# Patient Record
Sex: Female | Born: 1995 | Race: White | Hispanic: No | Marital: Single | Smoking: Current every day smoker
Health system: Southern US, Community
[De-identification: ages and names within clinical notes are randomized; demographics above are authoritative.]

## PROBLEM LIST (undated history)

## (undated) DIAGNOSIS — Z1379 Encounter for other screening for genetic and chromosomal anomalies: Secondary | ICD-10-CM

## (undated) DIAGNOSIS — Z808 Family history of malignant neoplasm of other organs or systems: Secondary | ICD-10-CM

## (undated) DIAGNOSIS — Z803 Family history of malignant neoplasm of breast: Secondary | ICD-10-CM

## (undated) DIAGNOSIS — Z800 Family history of malignant neoplasm of digestive organs: Secondary | ICD-10-CM

## (undated) DIAGNOSIS — E119 Type 2 diabetes mellitus without complications: Secondary | ICD-10-CM

## (undated) DIAGNOSIS — J020 Streptococcal pharyngitis: Secondary | ICD-10-CM

## (undated) DIAGNOSIS — F418 Other specified anxiety disorders: Secondary | ICD-10-CM

## (undated) DIAGNOSIS — R7302 Impaired glucose tolerance (oral): Secondary | ICD-10-CM

## (undated) DIAGNOSIS — F909 Attention-deficit hyperactivity disorder, unspecified type: Secondary | ICD-10-CM

## (undated) DIAGNOSIS — R000 Tachycardia, unspecified: Secondary | ICD-10-CM

## (undated) DIAGNOSIS — Z8049 Family history of malignant neoplasm of other genital organs: Secondary | ICD-10-CM

## (undated) DIAGNOSIS — F319 Bipolar disorder, unspecified: Secondary | ICD-10-CM

## (undated) DIAGNOSIS — I10 Essential (primary) hypertension: Secondary | ICD-10-CM

## (undated) HISTORY — DX: Other specified anxiety disorders: F41.8

## (undated) HISTORY — DX: Family history of malignant neoplasm of breast: Z80.3

## (undated) HISTORY — DX: Family history of malignant neoplasm of digestive organs: Z80.0

## (undated) HISTORY — DX: Attention-deficit hyperactivity disorder, unspecified type: F90.9

## (undated) HISTORY — DX: Family history of malignant neoplasm of other organs or systems: Z80.8

## (undated) HISTORY — DX: Type 2 diabetes mellitus without complications: E11.9

## (undated) HISTORY — DX: Bipolar disorder, unspecified: F31.9

## (undated) HISTORY — DX: Tachycardia, unspecified: R00.0

## (undated) HISTORY — DX: Encounter for other screening for genetic and chromosomal anomalies: Z13.79

## (undated) HISTORY — DX: Family history of malignant neoplasm of other genital organs: Z80.49

## (undated) HISTORY — DX: Impaired glucose tolerance (oral): R73.02

---
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
## 2016-07-04 HISTORY — PX: AUGMENTATION MAMMAPLASTY: SUR837

## 2017-07-07 DIAGNOSIS — F3132 Bipolar disorder, current episode depressed, moderate: Secondary | ICD-10-CM | POA: Diagnosis not present

## 2017-07-07 DIAGNOSIS — F902 Attention-deficit hyperactivity disorder, combined type: Secondary | ICD-10-CM | POA: Diagnosis not present

## 2017-07-07 DIAGNOSIS — F419 Anxiety disorder, unspecified: Secondary | ICD-10-CM | POA: Diagnosis not present

## 2017-07-21 DIAGNOSIS — Z23 Encounter for immunization: Secondary | ICD-10-CM | POA: Diagnosis not present

## 2017-08-04 DIAGNOSIS — F902 Attention-deficit hyperactivity disorder, combined type: Secondary | ICD-10-CM | POA: Diagnosis not present

## 2017-08-04 DIAGNOSIS — F419 Anxiety disorder, unspecified: Secondary | ICD-10-CM | POA: Diagnosis not present

## 2017-08-04 DIAGNOSIS — F3132 Bipolar disorder, current episode depressed, moderate: Secondary | ICD-10-CM | POA: Diagnosis not present

## 2017-08-21 DIAGNOSIS — F314 Bipolar disorder, current episode depressed, severe, without psychotic features: Secondary | ICD-10-CM | POA: Diagnosis not present

## 2017-08-28 DIAGNOSIS — F314 Bipolar disorder, current episode depressed, severe, without psychotic features: Secondary | ICD-10-CM | POA: Diagnosis not present

## 2017-08-28 DIAGNOSIS — G5602 Carpal tunnel syndrome, left upper limb: Secondary | ICD-10-CM | POA: Diagnosis not present

## 2017-09-11 DIAGNOSIS — F314 Bipolar disorder, current episode depressed, severe, without psychotic features: Secondary | ICD-10-CM | POA: Diagnosis not present

## 2017-09-18 DIAGNOSIS — F314 Bipolar disorder, current episode depressed, severe, without psychotic features: Secondary | ICD-10-CM | POA: Diagnosis not present

## 2017-09-22 DIAGNOSIS — F3132 Bipolar disorder, current episode depressed, moderate: Secondary | ICD-10-CM | POA: Diagnosis not present

## 2017-09-22 DIAGNOSIS — F902 Attention-deficit hyperactivity disorder, combined type: Secondary | ICD-10-CM | POA: Diagnosis not present

## 2017-09-22 DIAGNOSIS — F419 Anxiety disorder, unspecified: Secondary | ICD-10-CM | POA: Diagnosis not present

## 2017-10-02 DIAGNOSIS — F314 Bipolar disorder, current episode depressed, severe, without psychotic features: Secondary | ICD-10-CM | POA: Diagnosis not present

## 2017-10-10 DIAGNOSIS — F314 Bipolar disorder, current episode depressed, severe, without psychotic features: Secondary | ICD-10-CM | POA: Diagnosis not present

## 2017-10-13 DIAGNOSIS — J22 Unspecified acute lower respiratory infection: Secondary | ICD-10-CM | POA: Diagnosis not present

## 2017-10-13 DIAGNOSIS — R112 Nausea with vomiting, unspecified: Secondary | ICD-10-CM | POA: Diagnosis not present

## 2017-10-13 DIAGNOSIS — R05 Cough: Secondary | ICD-10-CM | POA: Diagnosis not present

## 2017-10-17 DIAGNOSIS — F314 Bipolar disorder, current episode depressed, severe, without psychotic features: Secondary | ICD-10-CM | POA: Diagnosis not present

## 2017-10-24 DIAGNOSIS — F314 Bipolar disorder, current episode depressed, severe, without psychotic features: Secondary | ICD-10-CM | POA: Diagnosis not present

## 2017-10-31 DIAGNOSIS — F314 Bipolar disorder, current episode depressed, severe, without psychotic features: Secondary | ICD-10-CM | POA: Diagnosis not present

## 2017-11-03 DIAGNOSIS — L03211 Cellulitis of face: Secondary | ICD-10-CM | POA: Diagnosis not present

## 2017-11-05 DIAGNOSIS — L03211 Cellulitis of face: Secondary | ICD-10-CM | POA: Diagnosis not present

## 2017-11-07 DIAGNOSIS — F314 Bipolar disorder, current episode depressed, severe, without psychotic features: Secondary | ICD-10-CM | POA: Diagnosis not present

## 2017-11-14 DIAGNOSIS — F314 Bipolar disorder, current episode depressed, severe, without psychotic features: Secondary | ICD-10-CM | POA: Diagnosis not present

## 2017-11-16 DIAGNOSIS — F3132 Bipolar disorder, current episode depressed, moderate: Secondary | ICD-10-CM | POA: Diagnosis not present

## 2017-11-16 DIAGNOSIS — F419 Anxiety disorder, unspecified: Secondary | ICD-10-CM | POA: Diagnosis not present

## 2017-11-16 DIAGNOSIS — F902 Attention-deficit hyperactivity disorder, combined type: Secondary | ICD-10-CM | POA: Diagnosis not present

## 2017-11-22 DIAGNOSIS — F314 Bipolar disorder, current episode depressed, severe, without psychotic features: Secondary | ICD-10-CM | POA: Diagnosis not present

## 2017-11-29 DIAGNOSIS — F314 Bipolar disorder, current episode depressed, severe, without psychotic features: Secondary | ICD-10-CM | POA: Diagnosis not present

## 2017-11-30 DIAGNOSIS — J358 Other chronic diseases of tonsils and adenoids: Secondary | ICD-10-CM | POA: Diagnosis not present

## 2017-11-30 DIAGNOSIS — R070 Pain in throat: Secondary | ICD-10-CM | POA: Diagnosis not present

## 2017-11-30 DIAGNOSIS — R0982 Postnasal drip: Secondary | ICD-10-CM | POA: Diagnosis not present

## 2017-12-14 DIAGNOSIS — F314 Bipolar disorder, current episode depressed, severe, without psychotic features: Secondary | ICD-10-CM | POA: Diagnosis not present

## 2017-12-14 DIAGNOSIS — F902 Attention-deficit hyperactivity disorder, combined type: Secondary | ICD-10-CM | POA: Diagnosis not present

## 2017-12-14 DIAGNOSIS — F419 Anxiety disorder, unspecified: Secondary | ICD-10-CM | POA: Diagnosis not present

## 2017-12-14 DIAGNOSIS — F3132 Bipolar disorder, current episode depressed, moderate: Secondary | ICD-10-CM | POA: Diagnosis not present

## 2017-12-27 DIAGNOSIS — F419 Anxiety disorder, unspecified: Secondary | ICD-10-CM | POA: Diagnosis not present

## 2017-12-27 DIAGNOSIS — F39 Unspecified mood [affective] disorder: Secondary | ICD-10-CM | POA: Diagnosis not present

## 2017-12-27 DIAGNOSIS — I10 Essential (primary) hypertension: Secondary | ICD-10-CM | POA: Diagnosis not present

## 2017-12-27 DIAGNOSIS — F329 Major depressive disorder, single episode, unspecified: Secondary | ICD-10-CM | POA: Diagnosis not present

## 2018-01-02 DIAGNOSIS — F314 Bipolar disorder, current episode depressed, severe, without psychotic features: Secondary | ICD-10-CM | POA: Diagnosis not present

## 2018-01-11 DIAGNOSIS — F3132 Bipolar disorder, current episode depressed, moderate: Secondary | ICD-10-CM | POA: Diagnosis not present

## 2018-01-11 DIAGNOSIS — F419 Anxiety disorder, unspecified: Secondary | ICD-10-CM | POA: Diagnosis not present

## 2018-01-11 DIAGNOSIS — F902 Attention-deficit hyperactivity disorder, combined type: Secondary | ICD-10-CM | POA: Diagnosis not present

## 2018-01-15 DIAGNOSIS — F314 Bipolar disorder, current episode depressed, severe, without psychotic features: Secondary | ICD-10-CM | POA: Diagnosis not present

## 2018-01-23 DIAGNOSIS — S93402A Sprain of unspecified ligament of left ankle, initial encounter: Secondary | ICD-10-CM | POA: Diagnosis not present

## 2018-01-23 DIAGNOSIS — M25572 Pain in left ankle and joints of left foot: Secondary | ICD-10-CM | POA: Diagnosis not present

## 2018-01-24 DIAGNOSIS — F314 Bipolar disorder, current episode depressed, severe, without psychotic features: Secondary | ICD-10-CM | POA: Diagnosis not present

## 2018-02-01 DIAGNOSIS — F902 Attention-deficit hyperactivity disorder, combined type: Secondary | ICD-10-CM | POA: Diagnosis not present

## 2018-02-01 DIAGNOSIS — F3132 Bipolar disorder, current episode depressed, moderate: Secondary | ICD-10-CM | POA: Diagnosis not present

## 2018-02-01 DIAGNOSIS — F314 Bipolar disorder, current episode depressed, severe, without psychotic features: Secondary | ICD-10-CM | POA: Diagnosis not present

## 2018-02-01 DIAGNOSIS — F419 Anxiety disorder, unspecified: Secondary | ICD-10-CM | POA: Diagnosis not present

## 2018-02-07 DIAGNOSIS — F314 Bipolar disorder, current episode depressed, severe, without psychotic features: Secondary | ICD-10-CM | POA: Diagnosis not present

## 2018-02-19 DIAGNOSIS — F314 Bipolar disorder, current episode depressed, severe, without psychotic features: Secondary | ICD-10-CM | POA: Diagnosis not present

## 2018-02-28 DIAGNOSIS — F314 Bipolar disorder, current episode depressed, severe, without psychotic features: Secondary | ICD-10-CM | POA: Diagnosis not present

## 2018-03-01 DIAGNOSIS — H903 Sensorineural hearing loss, bilateral: Secondary | ICD-10-CM | POA: Diagnosis not present

## 2018-03-05 DIAGNOSIS — F314 Bipolar disorder, current episode depressed, severe, without psychotic features: Secondary | ICD-10-CM | POA: Diagnosis not present

## 2018-03-06 DIAGNOSIS — F3132 Bipolar disorder, current episode depressed, moderate: Secondary | ICD-10-CM | POA: Diagnosis not present

## 2018-03-06 DIAGNOSIS — F419 Anxiety disorder, unspecified: Secondary | ICD-10-CM | POA: Diagnosis not present

## 2018-03-06 DIAGNOSIS — F902 Attention-deficit hyperactivity disorder, combined type: Secondary | ICD-10-CM | POA: Diagnosis not present

## 2018-03-07 DIAGNOSIS — Z5181 Encounter for therapeutic drug level monitoring: Secondary | ICD-10-CM | POA: Diagnosis not present

## 2018-03-14 DIAGNOSIS — M542 Cervicalgia: Secondary | ICD-10-CM | POA: Diagnosis not present

## 2018-03-14 DIAGNOSIS — R51 Headache: Secondary | ICD-10-CM | POA: Diagnosis not present

## 2018-03-14 DIAGNOSIS — M545 Low back pain: Secondary | ICD-10-CM | POA: Diagnosis not present

## 2018-03-14 DIAGNOSIS — Z1331 Encounter for screening for depression: Secondary | ICD-10-CM | POA: Diagnosis not present

## 2018-03-14 DIAGNOSIS — F314 Bipolar disorder, current episode depressed, severe, without psychotic features: Secondary | ICD-10-CM | POA: Diagnosis not present

## 2018-03-21 DIAGNOSIS — F314 Bipolar disorder, current episode depressed, severe, without psychotic features: Secondary | ICD-10-CM | POA: Diagnosis not present

## 2018-03-26 DIAGNOSIS — F314 Bipolar disorder, current episode depressed, severe, without psychotic features: Secondary | ICD-10-CM | POA: Diagnosis not present

## 2018-03-29 DIAGNOSIS — F314 Bipolar disorder, current episode depressed, severe, without psychotic features: Secondary | ICD-10-CM | POA: Diagnosis not present

## 2018-04-03 DIAGNOSIS — F3132 Bipolar disorder, current episode depressed, moderate: Secondary | ICD-10-CM | POA: Diagnosis not present

## 2018-04-03 DIAGNOSIS — F902 Attention-deficit hyperactivity disorder, combined type: Secondary | ICD-10-CM | POA: Diagnosis not present

## 2018-04-03 DIAGNOSIS — F314 Bipolar disorder, current episode depressed, severe, without psychotic features: Secondary | ICD-10-CM | POA: Diagnosis not present

## 2018-04-03 DIAGNOSIS — F419 Anxiety disorder, unspecified: Secondary | ICD-10-CM | POA: Diagnosis not present

## 2018-04-05 DIAGNOSIS — F314 Bipolar disorder, current episode depressed, severe, without psychotic features: Secondary | ICD-10-CM | POA: Diagnosis not present

## 2018-04-09 DIAGNOSIS — F314 Bipolar disorder, current episode depressed, severe, without psychotic features: Secondary | ICD-10-CM | POA: Diagnosis not present

## 2018-04-11 DIAGNOSIS — F314 Bipolar disorder, current episode depressed, severe, without psychotic features: Secondary | ICD-10-CM | POA: Diagnosis not present

## 2018-04-17 DIAGNOSIS — F314 Bipolar disorder, current episode depressed, severe, without psychotic features: Secondary | ICD-10-CM | POA: Diagnosis not present

## 2018-04-19 DIAGNOSIS — F314 Bipolar disorder, current episode depressed, severe, without psychotic features: Secondary | ICD-10-CM | POA: Diagnosis not present

## 2018-04-24 DIAGNOSIS — F314 Bipolar disorder, current episode depressed, severe, without psychotic features: Secondary | ICD-10-CM | POA: Diagnosis not present

## 2018-04-26 DIAGNOSIS — F314 Bipolar disorder, current episode depressed, severe, without psychotic features: Secondary | ICD-10-CM | POA: Diagnosis not present

## 2018-04-30 DIAGNOSIS — F314 Bipolar disorder, current episode depressed, severe, without psychotic features: Secondary | ICD-10-CM | POA: Diagnosis not present

## 2018-05-01 DIAGNOSIS — F3132 Bipolar disorder, current episode depressed, moderate: Secondary | ICD-10-CM | POA: Diagnosis not present

## 2018-05-01 DIAGNOSIS — F419 Anxiety disorder, unspecified: Secondary | ICD-10-CM | POA: Diagnosis not present

## 2018-05-01 DIAGNOSIS — F902 Attention-deficit hyperactivity disorder, combined type: Secondary | ICD-10-CM | POA: Diagnosis not present

## 2018-05-02 DIAGNOSIS — F314 Bipolar disorder, current episode depressed, severe, without psychotic features: Secondary | ICD-10-CM | POA: Diagnosis not present

## 2018-05-08 DIAGNOSIS — F314 Bipolar disorder, current episode depressed, severe, without psychotic features: Secondary | ICD-10-CM | POA: Diagnosis not present

## 2018-05-10 DIAGNOSIS — F314 Bipolar disorder, current episode depressed, severe, without psychotic features: Secondary | ICD-10-CM | POA: Diagnosis not present

## 2018-05-14 DIAGNOSIS — F314 Bipolar disorder, current episode depressed, severe, without psychotic features: Secondary | ICD-10-CM | POA: Diagnosis not present

## 2018-05-22 DIAGNOSIS — F314 Bipolar disorder, current episode depressed, severe, without psychotic features: Secondary | ICD-10-CM | POA: Diagnosis not present

## 2018-05-29 DIAGNOSIS — F419 Anxiety disorder, unspecified: Secondary | ICD-10-CM | POA: Diagnosis not present

## 2018-05-29 DIAGNOSIS — F3132 Bipolar disorder, current episode depressed, moderate: Secondary | ICD-10-CM | POA: Diagnosis not present

## 2018-05-29 DIAGNOSIS — F314 Bipolar disorder, current episode depressed, severe, without psychotic features: Secondary | ICD-10-CM | POA: Diagnosis not present

## 2018-05-29 DIAGNOSIS — F902 Attention-deficit hyperactivity disorder, combined type: Secondary | ICD-10-CM | POA: Diagnosis not present

## 2018-06-05 DIAGNOSIS — F315 Bipolar disorder, current episode depressed, severe, with psychotic features: Secondary | ICD-10-CM | POA: Diagnosis not present

## 2018-06-14 DIAGNOSIS — F315 Bipolar disorder, current episode depressed, severe, with psychotic features: Secondary | ICD-10-CM | POA: Diagnosis not present

## 2018-06-19 DIAGNOSIS — F902 Attention-deficit hyperactivity disorder, combined type: Secondary | ICD-10-CM | POA: Diagnosis not present

## 2018-06-19 DIAGNOSIS — F3132 Bipolar disorder, current episode depressed, moderate: Secondary | ICD-10-CM | POA: Diagnosis not present

## 2018-06-19 DIAGNOSIS — F315 Bipolar disorder, current episode depressed, severe, with psychotic features: Secondary | ICD-10-CM | POA: Diagnosis not present

## 2018-06-19 DIAGNOSIS — F419 Anxiety disorder, unspecified: Secondary | ICD-10-CM | POA: Diagnosis not present

## 2018-06-21 DIAGNOSIS — F315 Bipolar disorder, current episode depressed, severe, with psychotic features: Secondary | ICD-10-CM | POA: Diagnosis not present

## 2018-06-25 DIAGNOSIS — F315 Bipolar disorder, current episode depressed, severe, with psychotic features: Secondary | ICD-10-CM | POA: Diagnosis not present

## 2018-06-28 DIAGNOSIS — F315 Bipolar disorder, current episode depressed, severe, with psychotic features: Secondary | ICD-10-CM | POA: Diagnosis not present

## 2018-07-03 DIAGNOSIS — F315 Bipolar disorder, current episode depressed, severe, with psychotic features: Secondary | ICD-10-CM | POA: Diagnosis not present

## 2018-07-05 DIAGNOSIS — F315 Bipolar disorder, current episode depressed, severe, with psychotic features: Secondary | ICD-10-CM | POA: Diagnosis not present

## 2018-07-10 DIAGNOSIS — F315 Bipolar disorder, current episode depressed, severe, with psychotic features: Secondary | ICD-10-CM | POA: Diagnosis not present

## 2018-07-13 DIAGNOSIS — F315 Bipolar disorder, current episode depressed, severe, with psychotic features: Secondary | ICD-10-CM | POA: Diagnosis not present

## 2018-07-17 DIAGNOSIS — F3132 Bipolar disorder, current episode depressed, moderate: Secondary | ICD-10-CM | POA: Diagnosis not present

## 2018-07-17 DIAGNOSIS — F902 Attention-deficit hyperactivity disorder, combined type: Secondary | ICD-10-CM | POA: Diagnosis not present

## 2018-07-17 DIAGNOSIS — F315 Bipolar disorder, current episode depressed, severe, with psychotic features: Secondary | ICD-10-CM | POA: Diagnosis not present

## 2018-07-17 DIAGNOSIS — F419 Anxiety disorder, unspecified: Secondary | ICD-10-CM | POA: Diagnosis not present

## 2018-07-24 DIAGNOSIS — F315 Bipolar disorder, current episode depressed, severe, with psychotic features: Secondary | ICD-10-CM | POA: Diagnosis not present

## 2018-07-27 DIAGNOSIS — F315 Bipolar disorder, current episode depressed, severe, with psychotic features: Secondary | ICD-10-CM | POA: Diagnosis not present

## 2018-07-31 DIAGNOSIS — F315 Bipolar disorder, current episode depressed, severe, with psychotic features: Secondary | ICD-10-CM | POA: Diagnosis not present

## 2018-08-03 DIAGNOSIS — E785 Hyperlipidemia, unspecified: Secondary | ICD-10-CM | POA: Diagnosis not present

## 2018-08-03 DIAGNOSIS — I10 Essential (primary) hypertension: Secondary | ICD-10-CM | POA: Diagnosis not present

## 2018-08-03 DIAGNOSIS — E119 Type 2 diabetes mellitus without complications: Secondary | ICD-10-CM | POA: Diagnosis not present

## 2018-08-03 DIAGNOSIS — F39 Unspecified mood [affective] disorder: Secondary | ICD-10-CM | POA: Diagnosis not present

## 2018-08-03 DIAGNOSIS — F419 Anxiety disorder, unspecified: Secondary | ICD-10-CM | POA: Diagnosis not present

## 2018-08-07 DIAGNOSIS — F315 Bipolar disorder, current episode depressed, severe, with psychotic features: Secondary | ICD-10-CM | POA: Diagnosis not present

## 2018-08-14 DIAGNOSIS — F419 Anxiety disorder, unspecified: Secondary | ICD-10-CM | POA: Diagnosis not present

## 2018-08-14 DIAGNOSIS — F3132 Bipolar disorder, current episode depressed, moderate: Secondary | ICD-10-CM | POA: Diagnosis not present

## 2018-08-14 DIAGNOSIS — F902 Attention-deficit hyperactivity disorder, combined type: Secondary | ICD-10-CM | POA: Diagnosis not present

## 2018-08-15 DIAGNOSIS — F315 Bipolar disorder, current episode depressed, severe, with psychotic features: Secondary | ICD-10-CM | POA: Diagnosis not present

## 2018-08-22 DIAGNOSIS — F315 Bipolar disorder, current episode depressed, severe, with psychotic features: Secondary | ICD-10-CM | POA: Diagnosis not present

## 2018-08-28 DIAGNOSIS — F315 Bipolar disorder, current episode depressed, severe, with psychotic features: Secondary | ICD-10-CM | POA: Diagnosis not present

## 2018-08-31 DIAGNOSIS — F315 Bipolar disorder, current episode depressed, severe, with psychotic features: Secondary | ICD-10-CM | POA: Diagnosis not present

## 2018-09-04 DIAGNOSIS — F315 Bipolar disorder, current episode depressed, severe, with psychotic features: Secondary | ICD-10-CM | POA: Diagnosis not present

## 2018-09-14 DIAGNOSIS — F315 Bipolar disorder, current episode depressed, severe, with psychotic features: Secondary | ICD-10-CM | POA: Diagnosis not present

## 2018-09-18 DIAGNOSIS — F315 Bipolar disorder, current episode depressed, severe, with psychotic features: Secondary | ICD-10-CM | POA: Diagnosis not present

## 2018-09-20 DIAGNOSIS — Z6826 Body mass index (BMI) 26.0-26.9, adult: Secondary | ICD-10-CM | POA: Diagnosis not present

## 2018-09-20 DIAGNOSIS — Z118 Encounter for screening for other infectious and parasitic diseases: Secondary | ICD-10-CM | POA: Diagnosis not present

## 2018-09-20 DIAGNOSIS — Z113 Encounter for screening for infections with a predominantly sexual mode of transmission: Secondary | ICD-10-CM | POA: Diagnosis not present

## 2018-09-20 DIAGNOSIS — Z01419 Encounter for gynecological examination (general) (routine) without abnormal findings: Secondary | ICD-10-CM | POA: Diagnosis not present

## 2018-09-20 DIAGNOSIS — Z114 Encounter for screening for human immunodeficiency virus [HIV]: Secondary | ICD-10-CM | POA: Diagnosis not present

## 2018-09-20 DIAGNOSIS — Z1159 Encounter for screening for other viral diseases: Secondary | ICD-10-CM | POA: Diagnosis not present

## 2018-09-21 DIAGNOSIS — F315 Bipolar disorder, current episode depressed, severe, with psychotic features: Secondary | ICD-10-CM | POA: Diagnosis not present

## 2018-09-25 DIAGNOSIS — F315 Bipolar disorder, current episode depressed, severe, with psychotic features: Secondary | ICD-10-CM | POA: Diagnosis not present

## 2018-09-27 DIAGNOSIS — F315 Bipolar disorder, current episode depressed, severe, with psychotic features: Secondary | ICD-10-CM | POA: Diagnosis not present

## 2018-10-09 DIAGNOSIS — F315 Bipolar disorder, current episode depressed, severe, with psychotic features: Secondary | ICD-10-CM | POA: Diagnosis not present

## 2018-10-10 DIAGNOSIS — F419 Anxiety disorder, unspecified: Secondary | ICD-10-CM | POA: Diagnosis not present

## 2018-10-10 DIAGNOSIS — F902 Attention-deficit hyperactivity disorder, combined type: Secondary | ICD-10-CM | POA: Diagnosis not present

## 2018-10-10 DIAGNOSIS — F3132 Bipolar disorder, current episode depressed, moderate: Secondary | ICD-10-CM | POA: Diagnosis not present

## 2018-10-16 DIAGNOSIS — F315 Bipolar disorder, current episode depressed, severe, with psychotic features: Secondary | ICD-10-CM | POA: Diagnosis not present

## 2018-10-18 DIAGNOSIS — F315 Bipolar disorder, current episode depressed, severe, with psychotic features: Secondary | ICD-10-CM | POA: Diagnosis not present

## 2018-10-23 DIAGNOSIS — F315 Bipolar disorder, current episode depressed, severe, with psychotic features: Secondary | ICD-10-CM | POA: Diagnosis not present

## 2018-10-28 DIAGNOSIS — Z202 Contact with and (suspected) exposure to infections with a predominantly sexual mode of transmission: Secondary | ICD-10-CM | POA: Diagnosis not present

## 2018-10-28 DIAGNOSIS — K210 Gastro-esophageal reflux disease with esophagitis: Secondary | ICD-10-CM | POA: Diagnosis not present

## 2018-10-30 DIAGNOSIS — F314 Bipolar disorder, current episode depressed, severe, without psychotic features: Secondary | ICD-10-CM | POA: Diagnosis not present

## 2018-10-30 DIAGNOSIS — F411 Generalized anxiety disorder: Secondary | ICD-10-CM | POA: Diagnosis not present

## 2018-10-30 DIAGNOSIS — F909 Attention-deficit hyperactivity disorder, unspecified type: Secondary | ICD-10-CM | POA: Diagnosis not present

## 2018-11-06 DIAGNOSIS — F909 Attention-deficit hyperactivity disorder, unspecified type: Secondary | ICD-10-CM | POA: Diagnosis not present

## 2018-11-06 DIAGNOSIS — F411 Generalized anxiety disorder: Secondary | ICD-10-CM | POA: Diagnosis not present

## 2018-11-06 DIAGNOSIS — F314 Bipolar disorder, current episode depressed, severe, without psychotic features: Secondary | ICD-10-CM | POA: Diagnosis not present

## 2018-11-14 DIAGNOSIS — F411 Generalized anxiety disorder: Secondary | ICD-10-CM | POA: Diagnosis not present

## 2018-11-14 DIAGNOSIS — I10 Essential (primary) hypertension: Secondary | ICD-10-CM | POA: Diagnosis not present

## 2018-11-14 DIAGNOSIS — F900 Attention-deficit hyperactivity disorder, predominantly inattentive type: Secondary | ICD-10-CM | POA: Diagnosis not present

## 2018-11-14 DIAGNOSIS — F3160 Bipolar disorder, current episode mixed, unspecified: Secondary | ICD-10-CM | POA: Diagnosis not present

## 2018-11-15 DIAGNOSIS — F909 Attention-deficit hyperactivity disorder, unspecified type: Secondary | ICD-10-CM | POA: Diagnosis not present

## 2018-11-15 DIAGNOSIS — F411 Generalized anxiety disorder: Secondary | ICD-10-CM | POA: Diagnosis not present

## 2018-11-15 DIAGNOSIS — F314 Bipolar disorder, current episode depressed, severe, without psychotic features: Secondary | ICD-10-CM | POA: Diagnosis not present

## 2018-11-16 DIAGNOSIS — F411 Generalized anxiety disorder: Secondary | ICD-10-CM | POA: Diagnosis not present

## 2018-11-16 DIAGNOSIS — F314 Bipolar disorder, current episode depressed, severe, without psychotic features: Secondary | ICD-10-CM | POA: Diagnosis not present

## 2018-11-16 DIAGNOSIS — F909 Attention-deficit hyperactivity disorder, unspecified type: Secondary | ICD-10-CM | POA: Diagnosis not present

## 2018-11-19 DIAGNOSIS — F909 Attention-deficit hyperactivity disorder, unspecified type: Secondary | ICD-10-CM | POA: Diagnosis not present

## 2018-11-19 DIAGNOSIS — F411 Generalized anxiety disorder: Secondary | ICD-10-CM | POA: Diagnosis not present

## 2018-11-19 DIAGNOSIS — F314 Bipolar disorder, current episode depressed, severe, without psychotic features: Secondary | ICD-10-CM | POA: Diagnosis not present

## 2018-11-21 DIAGNOSIS — F411 Generalized anxiety disorder: Secondary | ICD-10-CM | POA: Diagnosis not present

## 2018-11-21 DIAGNOSIS — F314 Bipolar disorder, current episode depressed, severe, without psychotic features: Secondary | ICD-10-CM | POA: Diagnosis not present

## 2018-11-21 DIAGNOSIS — F909 Attention-deficit hyperactivity disorder, unspecified type: Secondary | ICD-10-CM | POA: Diagnosis not present

## 2018-11-27 DIAGNOSIS — F411 Generalized anxiety disorder: Secondary | ICD-10-CM | POA: Diagnosis not present

## 2018-11-27 DIAGNOSIS — F909 Attention-deficit hyperactivity disorder, unspecified type: Secondary | ICD-10-CM | POA: Diagnosis not present

## 2018-11-27 DIAGNOSIS — F314 Bipolar disorder, current episode depressed, severe, without psychotic features: Secondary | ICD-10-CM | POA: Diagnosis not present

## 2018-11-28 DIAGNOSIS — F909 Attention-deficit hyperactivity disorder, unspecified type: Secondary | ICD-10-CM | POA: Diagnosis not present

## 2018-11-28 DIAGNOSIS — F314 Bipolar disorder, current episode depressed, severe, without psychotic features: Secondary | ICD-10-CM | POA: Diagnosis not present

## 2018-11-28 DIAGNOSIS — F411 Generalized anxiety disorder: Secondary | ICD-10-CM | POA: Diagnosis not present

## 2018-11-28 DIAGNOSIS — F419 Anxiety disorder, unspecified: Secondary | ICD-10-CM | POA: Diagnosis not present

## 2018-11-28 DIAGNOSIS — F900 Attention-deficit hyperactivity disorder, predominantly inattentive type: Secondary | ICD-10-CM | POA: Diagnosis not present

## 2018-11-30 DIAGNOSIS — F314 Bipolar disorder, current episode depressed, severe, without psychotic features: Secondary | ICD-10-CM | POA: Diagnosis not present

## 2018-11-30 DIAGNOSIS — F411 Generalized anxiety disorder: Secondary | ICD-10-CM | POA: Diagnosis not present

## 2018-11-30 DIAGNOSIS — F909 Attention-deficit hyperactivity disorder, unspecified type: Secondary | ICD-10-CM | POA: Diagnosis not present

## 2018-12-03 DIAGNOSIS — F411 Generalized anxiety disorder: Secondary | ICD-10-CM | POA: Diagnosis not present

## 2018-12-03 DIAGNOSIS — F909 Attention-deficit hyperactivity disorder, unspecified type: Secondary | ICD-10-CM | POA: Diagnosis not present

## 2018-12-03 DIAGNOSIS — F314 Bipolar disorder, current episode depressed, severe, without psychotic features: Secondary | ICD-10-CM | POA: Diagnosis not present

## 2018-12-05 DIAGNOSIS — F411 Generalized anxiety disorder: Secondary | ICD-10-CM | POA: Diagnosis not present

## 2018-12-05 DIAGNOSIS — F909 Attention-deficit hyperactivity disorder, unspecified type: Secondary | ICD-10-CM | POA: Diagnosis not present

## 2018-12-05 DIAGNOSIS — F314 Bipolar disorder, current episode depressed, severe, without psychotic features: Secondary | ICD-10-CM | POA: Diagnosis not present

## 2018-12-06 DIAGNOSIS — K219 Gastro-esophageal reflux disease without esophagitis: Secondary | ICD-10-CM | POA: Diagnosis not present

## 2018-12-06 DIAGNOSIS — R110 Nausea: Secondary | ICD-10-CM | POA: Diagnosis not present

## 2018-12-07 DIAGNOSIS — F411 Generalized anxiety disorder: Secondary | ICD-10-CM | POA: Diagnosis not present

## 2018-12-07 DIAGNOSIS — F314 Bipolar disorder, current episode depressed, severe, without psychotic features: Secondary | ICD-10-CM | POA: Diagnosis not present

## 2018-12-07 DIAGNOSIS — F909 Attention-deficit hyperactivity disorder, unspecified type: Secondary | ICD-10-CM | POA: Diagnosis not present

## 2018-12-12 DIAGNOSIS — F319 Bipolar disorder, unspecified: Secondary | ICD-10-CM | POA: Diagnosis not present

## 2018-12-12 DIAGNOSIS — F411 Generalized anxiety disorder: Secondary | ICD-10-CM | POA: Diagnosis not present

## 2018-12-12 DIAGNOSIS — F909 Attention-deficit hyperactivity disorder, unspecified type: Secondary | ICD-10-CM | POA: Diagnosis not present

## 2018-12-12 DIAGNOSIS — F314 Bipolar disorder, current episode depressed, severe, without psychotic features: Secondary | ICD-10-CM | POA: Diagnosis not present

## 2018-12-17 DIAGNOSIS — I10 Essential (primary) hypertension: Secondary | ICD-10-CM | POA: Diagnosis not present

## 2018-12-17 DIAGNOSIS — K210 Gastro-esophageal reflux disease with esophagitis: Secondary | ICD-10-CM | POA: Diagnosis not present

## 2018-12-17 DIAGNOSIS — Z87891 Personal history of nicotine dependence: Secondary | ICD-10-CM | POA: Diagnosis not present

## 2018-12-17 DIAGNOSIS — K2970 Gastritis, unspecified, without bleeding: Secondary | ICD-10-CM | POA: Diagnosis not present

## 2018-12-17 DIAGNOSIS — R110 Nausea: Secondary | ICD-10-CM | POA: Diagnosis not present

## 2018-12-17 DIAGNOSIS — Z880 Allergy status to penicillin: Secondary | ICD-10-CM | POA: Diagnosis not present

## 2018-12-17 DIAGNOSIS — R112 Nausea with vomiting, unspecified: Secondary | ICD-10-CM | POA: Diagnosis not present

## 2018-12-17 DIAGNOSIS — Z7984 Long term (current) use of oral hypoglycemic drugs: Secondary | ICD-10-CM | POA: Diagnosis not present

## 2018-12-17 DIAGNOSIS — Z79899 Other long term (current) drug therapy: Secondary | ICD-10-CM | POA: Diagnosis not present

## 2018-12-17 DIAGNOSIS — K219 Gastro-esophageal reflux disease without esophagitis: Secondary | ICD-10-CM | POA: Diagnosis not present

## 2018-12-18 DIAGNOSIS — I10 Essential (primary) hypertension: Secondary | ICD-10-CM | POA: Diagnosis not present

## 2018-12-18 DIAGNOSIS — F39 Unspecified mood [affective] disorder: Secondary | ICD-10-CM | POA: Diagnosis not present

## 2018-12-18 DIAGNOSIS — G43909 Migraine, unspecified, not intractable, without status migrainosus: Secondary | ICD-10-CM | POA: Diagnosis not present

## 2018-12-18 DIAGNOSIS — F419 Anxiety disorder, unspecified: Secondary | ICD-10-CM | POA: Diagnosis not present

## 2018-12-18 DIAGNOSIS — E119 Type 2 diabetes mellitus without complications: Secondary | ICD-10-CM | POA: Diagnosis not present

## 2018-12-18 DIAGNOSIS — E785 Hyperlipidemia, unspecified: Secondary | ICD-10-CM | POA: Diagnosis not present

## 2018-12-25 DIAGNOSIS — F314 Bipolar disorder, current episode depressed, severe, without psychotic features: Secondary | ICD-10-CM | POA: Diagnosis not present

## 2018-12-25 DIAGNOSIS — F411 Generalized anxiety disorder: Secondary | ICD-10-CM | POA: Diagnosis not present

## 2018-12-25 DIAGNOSIS — F909 Attention-deficit hyperactivity disorder, unspecified type: Secondary | ICD-10-CM | POA: Diagnosis not present

## 2018-12-26 DIAGNOSIS — F3162 Bipolar disorder, current episode mixed, moderate: Secondary | ICD-10-CM | POA: Diagnosis not present

## 2019-01-01 DIAGNOSIS — F314 Bipolar disorder, current episode depressed, severe, without psychotic features: Secondary | ICD-10-CM | POA: Diagnosis not present

## 2019-01-01 DIAGNOSIS — F411 Generalized anxiety disorder: Secondary | ICD-10-CM | POA: Diagnosis not present

## 2019-01-01 DIAGNOSIS — F909 Attention-deficit hyperactivity disorder, unspecified type: Secondary | ICD-10-CM | POA: Diagnosis not present

## 2019-01-08 DIAGNOSIS — F3160 Bipolar disorder, current episode mixed, unspecified: Secondary | ICD-10-CM | POA: Diagnosis not present

## 2019-01-08 DIAGNOSIS — F314 Bipolar disorder, current episode depressed, severe, without psychotic features: Secondary | ICD-10-CM | POA: Diagnosis not present

## 2019-01-08 DIAGNOSIS — F411 Generalized anxiety disorder: Secondary | ICD-10-CM | POA: Diagnosis not present

## 2019-01-08 DIAGNOSIS — I10 Essential (primary) hypertension: Secondary | ICD-10-CM | POA: Diagnosis not present

## 2019-01-08 DIAGNOSIS — F900 Attention-deficit hyperactivity disorder, predominantly inattentive type: Secondary | ICD-10-CM | POA: Diagnosis not present

## 2019-01-08 DIAGNOSIS — F909 Attention-deficit hyperactivity disorder, unspecified type: Secondary | ICD-10-CM | POA: Diagnosis not present

## 2019-01-18 DIAGNOSIS — I10 Essential (primary) hypertension: Secondary | ICD-10-CM | POA: Diagnosis not present

## 2019-01-18 DIAGNOSIS — F411 Generalized anxiety disorder: Secondary | ICD-10-CM | POA: Diagnosis not present

## 2019-01-18 DIAGNOSIS — F900 Attention-deficit hyperactivity disorder, predominantly inattentive type: Secondary | ICD-10-CM | POA: Diagnosis not present

## 2019-01-18 DIAGNOSIS — F3162 Bipolar disorder, current episode mixed, moderate: Secondary | ICD-10-CM | POA: Diagnosis not present

## 2019-01-22 DIAGNOSIS — F909 Attention-deficit hyperactivity disorder, unspecified type: Secondary | ICD-10-CM | POA: Diagnosis not present

## 2019-01-22 DIAGNOSIS — F314 Bipolar disorder, current episode depressed, severe, without psychotic features: Secondary | ICD-10-CM | POA: Diagnosis not present

## 2019-01-22 DIAGNOSIS — F411 Generalized anxiety disorder: Secondary | ICD-10-CM | POA: Diagnosis not present

## 2019-01-25 DIAGNOSIS — F3162 Bipolar disorder, current episode mixed, moderate: Secondary | ICD-10-CM | POA: Diagnosis not present

## 2019-01-25 DIAGNOSIS — F411 Generalized anxiety disorder: Secondary | ICD-10-CM | POA: Diagnosis not present

## 2019-01-25 DIAGNOSIS — F900 Attention-deficit hyperactivity disorder, predominantly inattentive type: Secondary | ICD-10-CM | POA: Diagnosis not present

## 2019-01-25 DIAGNOSIS — I10 Essential (primary) hypertension: Secondary | ICD-10-CM | POA: Diagnosis not present

## 2019-01-30 DIAGNOSIS — F909 Attention-deficit hyperactivity disorder, unspecified type: Secondary | ICD-10-CM | POA: Diagnosis not present

## 2019-01-30 DIAGNOSIS — F314 Bipolar disorder, current episode depressed, severe, without psychotic features: Secondary | ICD-10-CM | POA: Diagnosis not present

## 2019-01-30 DIAGNOSIS — F411 Generalized anxiety disorder: Secondary | ICD-10-CM | POA: Diagnosis not present

## 2019-02-01 DIAGNOSIS — F900 Attention-deficit hyperactivity disorder, predominantly inattentive type: Secondary | ICD-10-CM | POA: Diagnosis not present

## 2019-02-01 DIAGNOSIS — I10 Essential (primary) hypertension: Secondary | ICD-10-CM | POA: Diagnosis not present

## 2019-02-01 DIAGNOSIS — F411 Generalized anxiety disorder: Secondary | ICD-10-CM | POA: Diagnosis not present

## 2019-02-01 DIAGNOSIS — F3162 Bipolar disorder, current episode mixed, moderate: Secondary | ICD-10-CM | POA: Diagnosis not present

## 2019-02-04 DIAGNOSIS — F909 Attention-deficit hyperactivity disorder, unspecified type: Secondary | ICD-10-CM | POA: Diagnosis not present

## 2019-02-04 DIAGNOSIS — F314 Bipolar disorder, current episode depressed, severe, without psychotic features: Secondary | ICD-10-CM | POA: Diagnosis not present

## 2019-02-04 DIAGNOSIS — F411 Generalized anxiety disorder: Secondary | ICD-10-CM | POA: Diagnosis not present

## 2019-02-08 DIAGNOSIS — I10 Essential (primary) hypertension: Secondary | ICD-10-CM | POA: Diagnosis not present

## 2019-02-08 DIAGNOSIS — F900 Attention-deficit hyperactivity disorder, predominantly inattentive type: Secondary | ICD-10-CM | POA: Diagnosis not present

## 2019-02-08 DIAGNOSIS — F411 Generalized anxiety disorder: Secondary | ICD-10-CM | POA: Diagnosis not present

## 2019-02-08 DIAGNOSIS — F3162 Bipolar disorder, current episode mixed, moderate: Secondary | ICD-10-CM | POA: Diagnosis not present

## 2019-02-15 DIAGNOSIS — F411 Generalized anxiety disorder: Secondary | ICD-10-CM | POA: Diagnosis not present

## 2019-02-15 DIAGNOSIS — I10 Essential (primary) hypertension: Secondary | ICD-10-CM | POA: Diagnosis not present

## 2019-02-15 DIAGNOSIS — F900 Attention-deficit hyperactivity disorder, predominantly inattentive type: Secondary | ICD-10-CM | POA: Diagnosis not present

## 2019-02-15 DIAGNOSIS — F3162 Bipolar disorder, current episode mixed, moderate: Secondary | ICD-10-CM | POA: Diagnosis not present

## 2019-03-06 DIAGNOSIS — F909 Attention-deficit hyperactivity disorder, unspecified type: Secondary | ICD-10-CM | POA: Diagnosis not present

## 2019-03-06 DIAGNOSIS — F314 Bipolar disorder, current episode depressed, severe, without psychotic features: Secondary | ICD-10-CM | POA: Diagnosis not present

## 2019-03-06 DIAGNOSIS — F411 Generalized anxiety disorder: Secondary | ICD-10-CM | POA: Diagnosis not present

## 2019-03-08 DIAGNOSIS — F900 Attention-deficit hyperactivity disorder, predominantly inattentive type: Secondary | ICD-10-CM | POA: Diagnosis not present

## 2019-03-08 DIAGNOSIS — F3162 Bipolar disorder, current episode mixed, moderate: Secondary | ICD-10-CM | POA: Diagnosis not present

## 2019-03-08 DIAGNOSIS — F411 Generalized anxiety disorder: Secondary | ICD-10-CM | POA: Diagnosis not present

## 2019-03-08 DIAGNOSIS — I10 Essential (primary) hypertension: Secondary | ICD-10-CM | POA: Diagnosis not present

## 2019-03-15 DIAGNOSIS — F902 Attention-deficit hyperactivity disorder, combined type: Secondary | ICD-10-CM | POA: Diagnosis not present

## 2019-03-15 DIAGNOSIS — I10 Essential (primary) hypertension: Secondary | ICD-10-CM | POA: Diagnosis not present

## 2019-03-15 DIAGNOSIS — F3162 Bipolar disorder, current episode mixed, moderate: Secondary | ICD-10-CM | POA: Diagnosis not present

## 2019-04-05 DIAGNOSIS — F3162 Bipolar disorder, current episode mixed, moderate: Secondary | ICD-10-CM | POA: Diagnosis not present

## 2019-04-05 DIAGNOSIS — F411 Generalized anxiety disorder: Secondary | ICD-10-CM | POA: Diagnosis not present

## 2019-04-05 DIAGNOSIS — F900 Attention-deficit hyperactivity disorder, predominantly inattentive type: Secondary | ICD-10-CM | POA: Diagnosis not present

## 2019-04-08 DIAGNOSIS — F3162 Bipolar disorder, current episode mixed, moderate: Secondary | ICD-10-CM | POA: Diagnosis not present

## 2019-04-08 DIAGNOSIS — F902 Attention-deficit hyperactivity disorder, combined type: Secondary | ICD-10-CM | POA: Diagnosis not present

## 2019-04-08 DIAGNOSIS — F419 Anxiety disorder, unspecified: Secondary | ICD-10-CM | POA: Diagnosis not present

## 2019-04-12 DIAGNOSIS — F3162 Bipolar disorder, current episode mixed, moderate: Secondary | ICD-10-CM | POA: Diagnosis not present

## 2019-04-12 DIAGNOSIS — F411 Generalized anxiety disorder: Secondary | ICD-10-CM | POA: Diagnosis not present

## 2019-04-12 DIAGNOSIS — I10 Essential (primary) hypertension: Secondary | ICD-10-CM | POA: Diagnosis not present

## 2019-04-12 DIAGNOSIS — F902 Attention-deficit hyperactivity disorder, combined type: Secondary | ICD-10-CM | POA: Diagnosis not present

## 2019-04-17 DIAGNOSIS — F3162 Bipolar disorder, current episode mixed, moderate: Secondary | ICD-10-CM | POA: Diagnosis not present

## 2019-04-17 DIAGNOSIS — I10 Essential (primary) hypertension: Secondary | ICD-10-CM | POA: Diagnosis not present

## 2019-04-17 DIAGNOSIS — F411 Generalized anxiety disorder: Secondary | ICD-10-CM | POA: Diagnosis not present

## 2019-04-17 DIAGNOSIS — F902 Attention-deficit hyperactivity disorder, combined type: Secondary | ICD-10-CM | POA: Diagnosis not present

## 2019-05-31 DIAGNOSIS — R079 Chest pain, unspecified: Secondary | ICD-10-CM | POA: Diagnosis not present

## 2019-05-31 DIAGNOSIS — R339 Retention of urine, unspecified: Secondary | ICD-10-CM | POA: Diagnosis not present

## 2019-05-31 DIAGNOSIS — F1590 Other stimulant use, unspecified, uncomplicated: Secondary | ICD-10-CM | POA: Diagnosis not present

## 2019-05-31 DIAGNOSIS — F419 Anxiety disorder, unspecified: Secondary | ICD-10-CM | POA: Diagnosis not present

## 2019-05-31 DIAGNOSIS — R519 Headache, unspecified: Secondary | ICD-10-CM | POA: Diagnosis not present

## 2019-05-31 DIAGNOSIS — I10 Essential (primary) hypertension: Secondary | ICD-10-CM | POA: Diagnosis not present

## 2019-05-31 DIAGNOSIS — Z79899 Other long term (current) drug therapy: Secondary | ICD-10-CM | POA: Diagnosis not present

## 2019-05-31 DIAGNOSIS — N3000 Acute cystitis without hematuria: Secondary | ICD-10-CM | POA: Diagnosis not present

## 2019-05-31 DIAGNOSIS — F329 Major depressive disorder, single episode, unspecified: Secondary | ICD-10-CM | POA: Diagnosis not present

## 2019-06-01 DIAGNOSIS — R079 Chest pain, unspecified: Secondary | ICD-10-CM | POA: Diagnosis not present

## 2019-06-08 DIAGNOSIS — Z8659 Personal history of other mental and behavioral disorders: Secondary | ICD-10-CM | POA: Diagnosis not present

## 2019-06-08 DIAGNOSIS — L089 Local infection of the skin and subcutaneous tissue, unspecified: Secondary | ICD-10-CM | POA: Diagnosis not present

## 2019-06-08 DIAGNOSIS — R030 Elevated blood-pressure reading, without diagnosis of hypertension: Secondary | ICD-10-CM | POA: Diagnosis not present

## 2019-06-08 DIAGNOSIS — L600 Ingrowing nail: Secondary | ICD-10-CM | POA: Diagnosis not present

## 2019-06-10 DIAGNOSIS — F3162 Bipolar disorder, current episode mixed, moderate: Secondary | ICD-10-CM | POA: Diagnosis not present

## 2019-06-10 DIAGNOSIS — I10 Essential (primary) hypertension: Secondary | ICD-10-CM | POA: Diagnosis not present

## 2019-06-10 DIAGNOSIS — F411 Generalized anxiety disorder: Secondary | ICD-10-CM | POA: Diagnosis not present

## 2019-06-10 DIAGNOSIS — H903 Sensorineural hearing loss, bilateral: Secondary | ICD-10-CM | POA: Diagnosis not present

## 2019-06-10 DIAGNOSIS — F900 Attention-deficit hyperactivity disorder, predominantly inattentive type: Secondary | ICD-10-CM | POA: Diagnosis not present

## 2019-06-13 DIAGNOSIS — F411 Generalized anxiety disorder: Secondary | ICD-10-CM | POA: Diagnosis not present

## 2019-06-13 DIAGNOSIS — I160 Hypertensive urgency: Secondary | ICD-10-CM | POA: Diagnosis not present

## 2019-06-13 DIAGNOSIS — F902 Attention-deficit hyperactivity disorder, combined type: Secondary | ICD-10-CM | POA: Diagnosis not present

## 2019-06-13 DIAGNOSIS — F3162 Bipolar disorder, current episode mixed, moderate: Secondary | ICD-10-CM | POA: Diagnosis not present

## 2019-06-14 DIAGNOSIS — F411 Generalized anxiety disorder: Secondary | ICD-10-CM | POA: Diagnosis not present

## 2019-06-14 DIAGNOSIS — F3162 Bipolar disorder, current episode mixed, moderate: Secondary | ICD-10-CM | POA: Diagnosis not present

## 2019-06-14 DIAGNOSIS — I10 Essential (primary) hypertension: Secondary | ICD-10-CM | POA: Diagnosis not present

## 2019-06-14 DIAGNOSIS — F900 Attention-deficit hyperactivity disorder, predominantly inattentive type: Secondary | ICD-10-CM | POA: Diagnosis not present

## 2019-06-17 DIAGNOSIS — F902 Attention-deficit hyperactivity disorder, combined type: Secondary | ICD-10-CM | POA: Diagnosis not present

## 2019-06-17 DIAGNOSIS — I10 Essential (primary) hypertension: Secondary | ICD-10-CM | POA: Diagnosis not present

## 2019-06-17 DIAGNOSIS — F3162 Bipolar disorder, current episode mixed, moderate: Secondary | ICD-10-CM | POA: Diagnosis not present

## 2019-06-17 DIAGNOSIS — F411 Generalized anxiety disorder: Secondary | ICD-10-CM | POA: Diagnosis not present

## 2019-06-18 DIAGNOSIS — F310 Bipolar disorder, current episode hypomanic: Secondary | ICD-10-CM | POA: Diagnosis not present

## 2019-06-20 DIAGNOSIS — F419 Anxiety disorder, unspecified: Secondary | ICD-10-CM | POA: Diagnosis not present

## 2019-06-20 DIAGNOSIS — F3162 Bipolar disorder, current episode mixed, moderate: Secondary | ICD-10-CM | POA: Diagnosis not present

## 2019-06-20 DIAGNOSIS — I10 Essential (primary) hypertension: Secondary | ICD-10-CM | POA: Diagnosis not present

## 2019-06-20 DIAGNOSIS — F902 Attention-deficit hyperactivity disorder, combined type: Secondary | ICD-10-CM | POA: Diagnosis not present

## 2019-06-21 DIAGNOSIS — S6992XA Unspecified injury of left wrist, hand and finger(s), initial encounter: Secondary | ICD-10-CM | POA: Diagnosis not present

## 2019-06-22 DIAGNOSIS — R9431 Abnormal electrocardiogram [ECG] [EKG]: Secondary | ICD-10-CM | POA: Diagnosis not present

## 2019-06-22 DIAGNOSIS — F23 Brief psychotic disorder: Secondary | ICD-10-CM | POA: Diagnosis not present

## 2019-06-22 DIAGNOSIS — F209 Schizophrenia, unspecified: Secondary | ICD-10-CM | POA: Diagnosis not present

## 2019-06-22 DIAGNOSIS — I10 Essential (primary) hypertension: Secondary | ICD-10-CM | POA: Diagnosis not present

## 2019-06-22 DIAGNOSIS — X58XXXA Exposure to other specified factors, initial encounter: Secondary | ICD-10-CM | POA: Diagnosis not present

## 2019-06-22 DIAGNOSIS — Z20828 Contact with and (suspected) exposure to other viral communicable diseases: Secondary | ICD-10-CM | POA: Diagnosis not present

## 2019-06-22 DIAGNOSIS — I959 Hypotension, unspecified: Secondary | ICD-10-CM | POA: Diagnosis not present

## 2019-06-22 DIAGNOSIS — R000 Tachycardia, unspecified: Secondary | ICD-10-CM | POA: Diagnosis not present

## 2019-06-22 DIAGNOSIS — D72829 Elevated white blood cell count, unspecified: Secondary | ICD-10-CM | POA: Diagnosis not present

## 2019-06-22 DIAGNOSIS — S80211A Abrasion, right knee, initial encounter: Secondary | ICD-10-CM | POA: Diagnosis not present

## 2019-06-22 DIAGNOSIS — R0902 Hypoxemia: Secondary | ICD-10-CM | POA: Diagnosis not present

## 2019-06-22 DIAGNOSIS — F29 Unspecified psychosis not due to a substance or known physiological condition: Secondary | ICD-10-CM | POA: Diagnosis not present

## 2019-06-23 DIAGNOSIS — Z9114 Patient's other noncompliance with medication regimen: Secondary | ICD-10-CM | POA: Diagnosis not present

## 2019-06-23 DIAGNOSIS — F209 Schizophrenia, unspecified: Secondary | ICD-10-CM | POA: Diagnosis not present

## 2019-06-23 DIAGNOSIS — I959 Hypotension, unspecified: Secondary | ICD-10-CM | POA: Diagnosis not present

## 2019-06-23 DIAGNOSIS — F250 Schizoaffective disorder, bipolar type: Secondary | ICD-10-CM | POA: Diagnosis not present

## 2019-06-23 DIAGNOSIS — S80211A Abrasion, right knee, initial encounter: Secondary | ICD-10-CM | POA: Diagnosis not present

## 2019-06-23 DIAGNOSIS — D72829 Elevated white blood cell count, unspecified: Secondary | ICD-10-CM | POA: Diagnosis not present

## 2019-06-23 DIAGNOSIS — I10 Essential (primary) hypertension: Secondary | ICD-10-CM | POA: Diagnosis not present

## 2019-06-23 DIAGNOSIS — Z20828 Contact with and (suspected) exposure to other viral communicable diseases: Secondary | ICD-10-CM | POA: Diagnosis not present

## 2019-06-23 DIAGNOSIS — X58XXXA Exposure to other specified factors, initial encounter: Secondary | ICD-10-CM | POA: Diagnosis not present

## 2019-06-23 DIAGNOSIS — F23 Brief psychotic disorder: Secondary | ICD-10-CM | POA: Diagnosis not present

## 2019-07-03 DIAGNOSIS — F411 Generalized anxiety disorder: Secondary | ICD-10-CM | POA: Diagnosis not present

## 2019-07-03 DIAGNOSIS — F312 Bipolar disorder, current episode manic severe with psychotic features: Secondary | ICD-10-CM | POA: Diagnosis not present

## 2019-07-03 DIAGNOSIS — F902 Attention-deficit hyperactivity disorder, combined type: Secondary | ICD-10-CM | POA: Diagnosis not present

## 2019-07-03 DIAGNOSIS — I10 Essential (primary) hypertension: Secondary | ICD-10-CM | POA: Diagnosis not present

## 2019-07-08 DIAGNOSIS — F411 Generalized anxiety disorder: Secondary | ICD-10-CM | POA: Diagnosis not present

## 2019-07-08 DIAGNOSIS — F902 Attention-deficit hyperactivity disorder, combined type: Secondary | ICD-10-CM | POA: Diagnosis not present

## 2019-07-08 DIAGNOSIS — I10 Essential (primary) hypertension: Secondary | ICD-10-CM | POA: Diagnosis not present

## 2019-07-08 DIAGNOSIS — F312 Bipolar disorder, current episode manic severe with psychotic features: Secondary | ICD-10-CM | POA: Diagnosis not present

## 2019-07-10 DIAGNOSIS — F419 Anxiety disorder, unspecified: Secondary | ICD-10-CM | POA: Diagnosis not present

## 2019-07-10 DIAGNOSIS — F909 Attention-deficit hyperactivity disorder, unspecified type: Secondary | ICD-10-CM | POA: Diagnosis not present

## 2019-07-10 DIAGNOSIS — I10 Essential (primary) hypertension: Secondary | ICD-10-CM | POA: Diagnosis not present

## 2019-07-10 DIAGNOSIS — E119 Type 2 diabetes mellitus without complications: Secondary | ICD-10-CM | POA: Diagnosis not present

## 2019-07-10 DIAGNOSIS — F902 Attention-deficit hyperactivity disorder, combined type: Secondary | ICD-10-CM | POA: Diagnosis not present

## 2019-07-10 DIAGNOSIS — F312 Bipolar disorder, current episode manic severe with psychotic features: Secondary | ICD-10-CM | POA: Diagnosis not present

## 2019-07-10 DIAGNOSIS — F411 Generalized anxiety disorder: Secondary | ICD-10-CM | POA: Diagnosis not present

## 2019-07-17 DIAGNOSIS — F312 Bipolar disorder, current episode manic severe with psychotic features: Secondary | ICD-10-CM | POA: Diagnosis not present

## 2019-07-17 DIAGNOSIS — F902 Attention-deficit hyperactivity disorder, combined type: Secondary | ICD-10-CM | POA: Diagnosis not present

## 2019-07-17 DIAGNOSIS — F411 Generalized anxiety disorder: Secondary | ICD-10-CM | POA: Diagnosis not present

## 2019-07-17 DIAGNOSIS — I10 Essential (primary) hypertension: Secondary | ICD-10-CM | POA: Diagnosis not present

## 2019-07-19 DIAGNOSIS — F312 Bipolar disorder, current episode manic severe with psychotic features: Secondary | ICD-10-CM | POA: Diagnosis not present

## 2019-07-19 DIAGNOSIS — I10 Essential (primary) hypertension: Secondary | ICD-10-CM | POA: Diagnosis not present

## 2019-07-19 DIAGNOSIS — F411 Generalized anxiety disorder: Secondary | ICD-10-CM | POA: Diagnosis not present

## 2019-07-19 DIAGNOSIS — F900 Attention-deficit hyperactivity disorder, predominantly inattentive type: Secondary | ICD-10-CM | POA: Diagnosis not present

## 2019-07-19 DIAGNOSIS — F902 Attention-deficit hyperactivity disorder, combined type: Secondary | ICD-10-CM | POA: Diagnosis not present

## 2019-07-24 DIAGNOSIS — F312 Bipolar disorder, current episode manic severe with psychotic features: Secondary | ICD-10-CM | POA: Diagnosis not present

## 2019-07-24 DIAGNOSIS — F411 Generalized anxiety disorder: Secondary | ICD-10-CM | POA: Diagnosis not present

## 2019-07-24 DIAGNOSIS — I10 Essential (primary) hypertension: Secondary | ICD-10-CM | POA: Diagnosis not present

## 2019-07-24 DIAGNOSIS — F902 Attention-deficit hyperactivity disorder, combined type: Secondary | ICD-10-CM | POA: Diagnosis not present

## 2019-07-30 DIAGNOSIS — F411 Generalized anxiety disorder: Secondary | ICD-10-CM | POA: Diagnosis not present

## 2019-07-30 DIAGNOSIS — I10 Essential (primary) hypertension: Secondary | ICD-10-CM | POA: Diagnosis not present

## 2019-07-30 DIAGNOSIS — F312 Bipolar disorder, current episode manic severe with psychotic features: Secondary | ICD-10-CM | POA: Diagnosis not present

## 2019-07-30 DIAGNOSIS — F902 Attention-deficit hyperactivity disorder, combined type: Secondary | ICD-10-CM | POA: Diagnosis not present

## 2019-07-31 DIAGNOSIS — F902 Attention-deficit hyperactivity disorder, combined type: Secondary | ICD-10-CM | POA: Diagnosis not present

## 2019-07-31 DIAGNOSIS — F3162 Bipolar disorder, current episode mixed, moderate: Secondary | ICD-10-CM | POA: Diagnosis not present

## 2019-07-31 DIAGNOSIS — F411 Generalized anxiety disorder: Secondary | ICD-10-CM | POA: Diagnosis not present

## 2019-07-31 DIAGNOSIS — I10 Essential (primary) hypertension: Secondary | ICD-10-CM | POA: Diagnosis not present

## 2019-08-06 DIAGNOSIS — I10 Essential (primary) hypertension: Secondary | ICD-10-CM | POA: Diagnosis not present

## 2019-08-06 DIAGNOSIS — F411 Generalized anxiety disorder: Secondary | ICD-10-CM | POA: Diagnosis not present

## 2019-08-06 DIAGNOSIS — F312 Bipolar disorder, current episode manic severe with psychotic features: Secondary | ICD-10-CM | POA: Diagnosis not present

## 2019-08-06 DIAGNOSIS — F902 Attention-deficit hyperactivity disorder, combined type: Secondary | ICD-10-CM | POA: Diagnosis not present

## 2019-08-13 DIAGNOSIS — F902 Attention-deficit hyperactivity disorder, combined type: Secondary | ICD-10-CM | POA: Diagnosis not present

## 2019-08-13 DIAGNOSIS — I10 Essential (primary) hypertension: Secondary | ICD-10-CM | POA: Diagnosis not present

## 2019-08-13 DIAGNOSIS — F312 Bipolar disorder, current episode manic severe with psychotic features: Secondary | ICD-10-CM | POA: Diagnosis not present

## 2019-08-13 DIAGNOSIS — F411 Generalized anxiety disorder: Secondary | ICD-10-CM | POA: Diagnosis not present

## 2019-08-14 DIAGNOSIS — F312 Bipolar disorder, current episode manic severe with psychotic features: Secondary | ICD-10-CM | POA: Diagnosis not present

## 2019-08-14 DIAGNOSIS — I10 Essential (primary) hypertension: Secondary | ICD-10-CM | POA: Diagnosis not present

## 2019-08-14 DIAGNOSIS — F902 Attention-deficit hyperactivity disorder, combined type: Secondary | ICD-10-CM | POA: Diagnosis not present

## 2019-08-14 DIAGNOSIS — F411 Generalized anxiety disorder: Secondary | ICD-10-CM | POA: Diagnosis not present

## 2019-08-18 DIAGNOSIS — F411 Generalized anxiety disorder: Secondary | ICD-10-CM | POA: Diagnosis not present

## 2019-08-18 DIAGNOSIS — F900 Attention-deficit hyperactivity disorder, predominantly inattentive type: Secondary | ICD-10-CM | POA: Diagnosis not present

## 2019-08-18 DIAGNOSIS — T8090XA Unspecified complication following infusion and therapeutic injection, initial encounter: Secondary | ICD-10-CM | POA: Diagnosis not present

## 2019-08-18 DIAGNOSIS — F312 Bipolar disorder, current episode manic severe with psychotic features: Secondary | ICD-10-CM | POA: Diagnosis not present

## 2019-08-18 DIAGNOSIS — I10 Essential (primary) hypertension: Secondary | ICD-10-CM | POA: Diagnosis not present

## 2019-08-21 DIAGNOSIS — F411 Generalized anxiety disorder: Secondary | ICD-10-CM | POA: Diagnosis not present

## 2019-08-21 DIAGNOSIS — F312 Bipolar disorder, current episode manic severe with psychotic features: Secondary | ICD-10-CM | POA: Diagnosis not present

## 2019-08-21 DIAGNOSIS — F902 Attention-deficit hyperactivity disorder, combined type: Secondary | ICD-10-CM | POA: Diagnosis not present

## 2019-08-21 DIAGNOSIS — I10 Essential (primary) hypertension: Secondary | ICD-10-CM | POA: Diagnosis not present

## 2019-08-26 DIAGNOSIS — H903 Sensorineural hearing loss, bilateral: Secondary | ICD-10-CM | POA: Diagnosis not present

## 2019-08-28 DIAGNOSIS — F411 Generalized anxiety disorder: Secondary | ICD-10-CM | POA: Diagnosis not present

## 2019-08-28 DIAGNOSIS — F902 Attention-deficit hyperactivity disorder, combined type: Secondary | ICD-10-CM | POA: Diagnosis not present

## 2019-08-28 DIAGNOSIS — F312 Bipolar disorder, current episode manic severe with psychotic features: Secondary | ICD-10-CM | POA: Diagnosis not present

## 2019-08-28 DIAGNOSIS — I10 Essential (primary) hypertension: Secondary | ICD-10-CM | POA: Diagnosis not present

## 2019-09-03 DIAGNOSIS — F411 Generalized anxiety disorder: Secondary | ICD-10-CM | POA: Diagnosis not present

## 2019-09-03 DIAGNOSIS — F900 Attention-deficit hyperactivity disorder, predominantly inattentive type: Secondary | ICD-10-CM | POA: Diagnosis not present

## 2019-09-03 DIAGNOSIS — F3173 Bipolar disorder, in partial remission, most recent episode manic: Secondary | ICD-10-CM | POA: Diagnosis not present

## 2019-09-04 DIAGNOSIS — F411 Generalized anxiety disorder: Secondary | ICD-10-CM | POA: Diagnosis not present

## 2019-09-04 DIAGNOSIS — I10 Essential (primary) hypertension: Secondary | ICD-10-CM | POA: Diagnosis not present

## 2019-09-04 DIAGNOSIS — F902 Attention-deficit hyperactivity disorder, combined type: Secondary | ICD-10-CM | POA: Diagnosis not present

## 2019-09-04 DIAGNOSIS — F312 Bipolar disorder, current episode manic severe with psychotic features: Secondary | ICD-10-CM | POA: Diagnosis not present

## 2019-09-06 DIAGNOSIS — F411 Generalized anxiety disorder: Secondary | ICD-10-CM | POA: Diagnosis not present

## 2019-09-10 DIAGNOSIS — F902 Attention-deficit hyperactivity disorder, combined type: Secondary | ICD-10-CM | POA: Diagnosis not present

## 2019-09-10 DIAGNOSIS — F411 Generalized anxiety disorder: Secondary | ICD-10-CM | POA: Diagnosis not present

## 2019-09-10 DIAGNOSIS — F319 Bipolar disorder, unspecified: Secondary | ICD-10-CM | POA: Diagnosis not present

## 2019-09-11 DIAGNOSIS — F902 Attention-deficit hyperactivity disorder, combined type: Secondary | ICD-10-CM | POA: Diagnosis not present

## 2019-09-11 DIAGNOSIS — F3173 Bipolar disorder, in partial remission, most recent episode manic: Secondary | ICD-10-CM | POA: Diagnosis not present

## 2019-09-11 DIAGNOSIS — F411 Generalized anxiety disorder: Secondary | ICD-10-CM | POA: Diagnosis not present

## 2019-09-11 DIAGNOSIS — I10 Essential (primary) hypertension: Secondary | ICD-10-CM | POA: Diagnosis not present

## 2019-09-13 DIAGNOSIS — F319 Bipolar disorder, unspecified: Secondary | ICD-10-CM | POA: Diagnosis not present

## 2019-09-13 DIAGNOSIS — F411 Generalized anxiety disorder: Secondary | ICD-10-CM | POA: Diagnosis not present

## 2019-09-13 DIAGNOSIS — F902 Attention-deficit hyperactivity disorder, combined type: Secondary | ICD-10-CM | POA: Diagnosis not present

## 2019-09-17 ENCOUNTER — Telehealth: Payer: Self-pay

## 2019-09-17 DIAGNOSIS — F411 Generalized anxiety disorder: Secondary | ICD-10-CM | POA: Diagnosis not present

## 2019-09-17 DIAGNOSIS — F319 Bipolar disorder, unspecified: Secondary | ICD-10-CM | POA: Diagnosis not present

## 2019-09-17 DIAGNOSIS — F902 Attention-deficit hyperactivity disorder, combined type: Secondary | ICD-10-CM | POA: Diagnosis not present

## 2019-09-17 NOTE — Telephone Encounter (Signed)
NOTES ON FILE FROM JESSICA [facility] [phone] SENT REFERRAL TO SCHEDULING

## 2019-09-18 DIAGNOSIS — F900 Attention-deficit hyperactivity disorder, predominantly inattentive type: Secondary | ICD-10-CM | POA: Diagnosis not present

## 2019-09-18 DIAGNOSIS — F411 Generalized anxiety disorder: Secondary | ICD-10-CM | POA: Diagnosis not present

## 2019-09-18 DIAGNOSIS — F3173 Bipolar disorder, in partial remission, most recent episode manic: Secondary | ICD-10-CM | POA: Diagnosis not present

## 2019-09-18 DIAGNOSIS — I10 Essential (primary) hypertension: Secondary | ICD-10-CM | POA: Diagnosis not present

## 2019-09-20 DIAGNOSIS — F319 Bipolar disorder, unspecified: Secondary | ICD-10-CM | POA: Diagnosis not present

## 2019-09-20 DIAGNOSIS — F411 Generalized anxiety disorder: Secondary | ICD-10-CM | POA: Diagnosis not present

## 2019-09-20 DIAGNOSIS — F902 Attention-deficit hyperactivity disorder, combined type: Secondary | ICD-10-CM | POA: Diagnosis not present

## 2019-09-24 DIAGNOSIS — M542 Cervicalgia: Secondary | ICD-10-CM | POA: Diagnosis not present

## 2019-09-24 DIAGNOSIS — Z6827 Body mass index (BMI) 27.0-27.9, adult: Secondary | ICD-10-CM | POA: Diagnosis not present

## 2019-09-24 DIAGNOSIS — F411 Generalized anxiety disorder: Secondary | ICD-10-CM | POA: Diagnosis not present

## 2019-09-24 DIAGNOSIS — R030 Elevated blood-pressure reading, without diagnosis of hypertension: Secondary | ICD-10-CM | POA: Diagnosis not present

## 2019-09-24 DIAGNOSIS — F902 Attention-deficit hyperactivity disorder, combined type: Secondary | ICD-10-CM | POA: Diagnosis not present

## 2019-09-24 DIAGNOSIS — F319 Bipolar disorder, unspecified: Secondary | ICD-10-CM | POA: Diagnosis not present

## 2019-09-25 DIAGNOSIS — F3173 Bipolar disorder, in partial remission, most recent episode manic: Secondary | ICD-10-CM | POA: Diagnosis not present

## 2019-09-25 DIAGNOSIS — F411 Generalized anxiety disorder: Secondary | ICD-10-CM | POA: Diagnosis not present

## 2019-09-25 DIAGNOSIS — I10 Essential (primary) hypertension: Secondary | ICD-10-CM | POA: Diagnosis not present

## 2019-09-25 DIAGNOSIS — F900 Attention-deficit hyperactivity disorder, predominantly inattentive type: Secondary | ICD-10-CM | POA: Diagnosis not present

## 2019-09-27 DIAGNOSIS — F902 Attention-deficit hyperactivity disorder, combined type: Secondary | ICD-10-CM | POA: Diagnosis not present

## 2019-09-27 DIAGNOSIS — F411 Generalized anxiety disorder: Secondary | ICD-10-CM | POA: Diagnosis not present

## 2019-09-27 DIAGNOSIS — F319 Bipolar disorder, unspecified: Secondary | ICD-10-CM | POA: Diagnosis not present

## 2019-10-01 DIAGNOSIS — F319 Bipolar disorder, unspecified: Secondary | ICD-10-CM | POA: Diagnosis not present

## 2019-10-01 DIAGNOSIS — F902 Attention-deficit hyperactivity disorder, combined type: Secondary | ICD-10-CM | POA: Diagnosis not present

## 2019-10-01 DIAGNOSIS — F419 Anxiety disorder, unspecified: Secondary | ICD-10-CM | POA: Diagnosis not present

## 2019-10-04 DIAGNOSIS — F902 Attention-deficit hyperactivity disorder, combined type: Secondary | ICD-10-CM | POA: Diagnosis not present

## 2019-10-04 DIAGNOSIS — I10 Essential (primary) hypertension: Secondary | ICD-10-CM | POA: Diagnosis not present

## 2019-10-04 DIAGNOSIS — F419 Anxiety disorder, unspecified: Secondary | ICD-10-CM | POA: Diagnosis not present

## 2019-10-04 DIAGNOSIS — F319 Bipolar disorder, unspecified: Secondary | ICD-10-CM | POA: Diagnosis not present

## 2019-10-09 DIAGNOSIS — F900 Attention-deficit hyperactivity disorder, predominantly inattentive type: Secondary | ICD-10-CM | POA: Diagnosis not present

## 2019-10-09 DIAGNOSIS — F840 Autistic disorder: Secondary | ICD-10-CM | POA: Diagnosis not present

## 2019-10-09 DIAGNOSIS — F419 Anxiety disorder, unspecified: Secondary | ICD-10-CM | POA: Diagnosis not present

## 2019-10-09 DIAGNOSIS — F312 Bipolar disorder, current episode manic severe with psychotic features: Secondary | ICD-10-CM | POA: Diagnosis not present

## 2019-10-09 DIAGNOSIS — F319 Bipolar disorder, unspecified: Secondary | ICD-10-CM | POA: Diagnosis not present

## 2019-10-09 DIAGNOSIS — F902 Attention-deficit hyperactivity disorder, combined type: Secondary | ICD-10-CM | POA: Diagnosis not present

## 2019-10-09 DIAGNOSIS — F411 Generalized anxiety disorder: Secondary | ICD-10-CM | POA: Diagnosis not present

## 2019-10-11 DIAGNOSIS — M542 Cervicalgia: Secondary | ICD-10-CM | POA: Diagnosis not present

## 2019-10-11 DIAGNOSIS — Z0389 Encounter for observation for other suspected diseases and conditions ruled out: Secondary | ICD-10-CM | POA: Diagnosis not present

## 2019-10-14 DIAGNOSIS — F419 Anxiety disorder, unspecified: Secondary | ICD-10-CM | POA: Diagnosis not present

## 2019-10-14 DIAGNOSIS — I10 Essential (primary) hypertension: Secondary | ICD-10-CM | POA: Diagnosis not present

## 2019-10-14 DIAGNOSIS — F39 Unspecified mood [affective] disorder: Secondary | ICD-10-CM | POA: Diagnosis not present

## 2019-10-14 DIAGNOSIS — F909 Attention-deficit hyperactivity disorder, unspecified type: Secondary | ICD-10-CM | POA: Diagnosis not present

## 2019-10-15 DIAGNOSIS — F902 Attention-deficit hyperactivity disorder, combined type: Secondary | ICD-10-CM | POA: Diagnosis not present

## 2019-10-15 DIAGNOSIS — F319 Bipolar disorder, unspecified: Secondary | ICD-10-CM | POA: Diagnosis not present

## 2019-10-15 DIAGNOSIS — F411 Generalized anxiety disorder: Secondary | ICD-10-CM | POA: Diagnosis not present

## 2019-10-16 DIAGNOSIS — F840 Autistic disorder: Secondary | ICD-10-CM | POA: Diagnosis not present

## 2019-10-16 DIAGNOSIS — F902 Attention-deficit hyperactivity disorder, combined type: Secondary | ICD-10-CM | POA: Diagnosis not present

## 2019-10-16 DIAGNOSIS — I10 Essential (primary) hypertension: Secondary | ICD-10-CM | POA: Diagnosis not present

## 2019-10-16 DIAGNOSIS — F411 Generalized anxiety disorder: Secondary | ICD-10-CM | POA: Diagnosis not present

## 2019-10-17 DIAGNOSIS — M542 Cervicalgia: Secondary | ICD-10-CM | POA: Diagnosis not present

## 2019-10-18 DIAGNOSIS — F411 Generalized anxiety disorder: Secondary | ICD-10-CM | POA: Diagnosis not present

## 2019-10-18 DIAGNOSIS — F902 Attention-deficit hyperactivity disorder, combined type: Secondary | ICD-10-CM | POA: Diagnosis not present

## 2019-10-18 DIAGNOSIS — F319 Bipolar disorder, unspecified: Secondary | ICD-10-CM | POA: Diagnosis not present

## 2019-10-18 DIAGNOSIS — N611 Abscess of the breast and nipple: Secondary | ICD-10-CM | POA: Diagnosis not present

## 2019-10-18 DIAGNOSIS — R7309 Other abnormal glucose: Secondary | ICD-10-CM | POA: Diagnosis not present

## 2019-10-21 ENCOUNTER — Other Ambulatory Visit: Payer: Self-pay | Admitting: Student

## 2019-10-21 DIAGNOSIS — Z113 Encounter for screening for infections with a predominantly sexual mode of transmission: Secondary | ICD-10-CM | POA: Diagnosis not present

## 2019-10-21 DIAGNOSIS — N926 Irregular menstruation, unspecified: Secondary | ICD-10-CM | POA: Diagnosis not present

## 2019-10-21 DIAGNOSIS — Z124 Encounter for screening for malignant neoplasm of cervix: Secondary | ICD-10-CM | POA: Diagnosis not present

## 2019-10-21 DIAGNOSIS — Z01419 Encounter for gynecological examination (general) (routine) without abnormal findings: Secondary | ICD-10-CM | POA: Diagnosis not present

## 2019-10-21 DIAGNOSIS — Z6827 Body mass index (BMI) 27.0-27.9, adult: Secondary | ICD-10-CM | POA: Diagnosis not present

## 2019-10-21 DIAGNOSIS — N611 Abscess of the breast and nipple: Secondary | ICD-10-CM

## 2019-10-23 DIAGNOSIS — F840 Autistic disorder: Secondary | ICD-10-CM | POA: Diagnosis not present

## 2019-10-23 DIAGNOSIS — F3162 Bipolar disorder, current episode mixed, moderate: Secondary | ICD-10-CM | POA: Diagnosis not present

## 2019-10-23 DIAGNOSIS — I10 Essential (primary) hypertension: Secondary | ICD-10-CM | POA: Diagnosis not present

## 2019-10-23 DIAGNOSIS — F411 Generalized anxiety disorder: Secondary | ICD-10-CM | POA: Diagnosis not present

## 2019-10-24 ENCOUNTER — Inpatient Hospital Stay: Admission: RE | Admit: 2019-10-24 | Payer: BLUE CROSS/BLUE SHIELD | Source: Ambulatory Visit

## 2019-10-24 ENCOUNTER — Other Ambulatory Visit: Payer: BLUE CROSS/BLUE SHIELD

## 2019-10-25 DIAGNOSIS — F411 Generalized anxiety disorder: Secondary | ICD-10-CM | POA: Diagnosis not present

## 2019-10-25 DIAGNOSIS — F319 Bipolar disorder, unspecified: Secondary | ICD-10-CM | POA: Diagnosis not present

## 2019-10-25 DIAGNOSIS — F902 Attention-deficit hyperactivity disorder, combined type: Secondary | ICD-10-CM | POA: Diagnosis not present

## 2019-10-29 DIAGNOSIS — R7303 Prediabetes: Secondary | ICD-10-CM | POA: Diagnosis not present

## 2019-10-29 DIAGNOSIS — F319 Bipolar disorder, unspecified: Secondary | ICD-10-CM | POA: Diagnosis not present

## 2019-10-29 DIAGNOSIS — F902 Attention-deficit hyperactivity disorder, combined type: Secondary | ICD-10-CM | POA: Diagnosis not present

## 2019-10-29 DIAGNOSIS — F411 Generalized anxiety disorder: Secondary | ICD-10-CM | POA: Diagnosis not present

## 2019-11-01 DIAGNOSIS — F902 Attention-deficit hyperactivity disorder, combined type: Secondary | ICD-10-CM | POA: Diagnosis not present

## 2019-11-01 DIAGNOSIS — F411 Generalized anxiety disorder: Secondary | ICD-10-CM | POA: Diagnosis not present

## 2019-11-01 DIAGNOSIS — F319 Bipolar disorder, unspecified: Secondary | ICD-10-CM | POA: Diagnosis not present

## 2019-11-04 ENCOUNTER — Ambulatory Visit
Admission: RE | Admit: 2019-11-04 | Discharge: 2019-11-04 | Disposition: A | Discharge status: Home / Self Care | Payer: BC Managed Care – PPO | Source: Ambulatory Visit | Attending: Student | Admitting: Student

## 2019-11-04 ENCOUNTER — Other Ambulatory Visit: Payer: Self-pay

## 2019-11-04 ENCOUNTER — Other Ambulatory Visit: Payer: Self-pay | Admitting: Student

## 2019-11-04 DIAGNOSIS — N6320 Unspecified lump in the left breast, unspecified quadrant: Secondary | ICD-10-CM | POA: Diagnosis not present

## 2019-11-04 DIAGNOSIS — N611 Abscess of the breast and nipple: Secondary | ICD-10-CM

## 2019-11-05 DIAGNOSIS — Z309 Encounter for contraceptive management, unspecified: Secondary | ICD-10-CM | POA: Diagnosis not present

## 2019-11-05 DIAGNOSIS — Z6827 Body mass index (BMI) 27.0-27.9, adult: Secondary | ICD-10-CM | POA: Diagnosis not present

## 2019-11-05 DIAGNOSIS — F319 Bipolar disorder, unspecified: Secondary | ICD-10-CM | POA: Diagnosis not present

## 2019-11-05 DIAGNOSIS — F902 Attention-deficit hyperactivity disorder, combined type: Secondary | ICD-10-CM | POA: Diagnosis not present

## 2019-11-05 DIAGNOSIS — F411 Generalized anxiety disorder: Secondary | ICD-10-CM | POA: Diagnosis not present

## 2019-11-06 DIAGNOSIS — F902 Attention-deficit hyperactivity disorder, combined type: Secondary | ICD-10-CM | POA: Diagnosis not present

## 2019-11-06 DIAGNOSIS — I10 Essential (primary) hypertension: Secondary | ICD-10-CM | POA: Diagnosis not present

## 2019-11-06 DIAGNOSIS — F3132 Bipolar disorder, current episode depressed, moderate: Secondary | ICD-10-CM | POA: Diagnosis not present

## 2019-11-06 DIAGNOSIS — F411 Generalized anxiety disorder: Secondary | ICD-10-CM | POA: Diagnosis not present

## 2019-11-08 DIAGNOSIS — F319 Bipolar disorder, unspecified: Secondary | ICD-10-CM | POA: Diagnosis not present

## 2019-11-08 DIAGNOSIS — F902 Attention-deficit hyperactivity disorder, combined type: Secondary | ICD-10-CM | POA: Diagnosis not present

## 2019-11-08 DIAGNOSIS — F411 Generalized anxiety disorder: Secondary | ICD-10-CM | POA: Diagnosis not present

## 2019-11-15 DIAGNOSIS — F411 Generalized anxiety disorder: Secondary | ICD-10-CM | POA: Diagnosis not present

## 2019-11-15 DIAGNOSIS — F319 Bipolar disorder, unspecified: Secondary | ICD-10-CM | POA: Diagnosis not present

## 2019-11-15 DIAGNOSIS — F902 Attention-deficit hyperactivity disorder, combined type: Secondary | ICD-10-CM | POA: Diagnosis not present

## 2019-11-18 ENCOUNTER — Other Ambulatory Visit: Payer: BC Managed Care – PPO

## 2019-11-19 DIAGNOSIS — F319 Bipolar disorder, unspecified: Secondary | ICD-10-CM | POA: Diagnosis not present

## 2019-11-19 DIAGNOSIS — F902 Attention-deficit hyperactivity disorder, combined type: Secondary | ICD-10-CM | POA: Diagnosis not present

## 2019-11-19 DIAGNOSIS — F411 Generalized anxiety disorder: Secondary | ICD-10-CM | POA: Diagnosis not present

## 2019-11-20 DIAGNOSIS — F3132 Bipolar disorder, current episode depressed, moderate: Secondary | ICD-10-CM | POA: Diagnosis not present

## 2019-11-20 DIAGNOSIS — F902 Attention-deficit hyperactivity disorder, combined type: Secondary | ICD-10-CM | POA: Diagnosis not present

## 2019-11-20 DIAGNOSIS — R635 Abnormal weight gain: Secondary | ICD-10-CM | POA: Diagnosis not present

## 2019-11-20 DIAGNOSIS — F411 Generalized anxiety disorder: Secondary | ICD-10-CM | POA: Diagnosis not present

## 2019-11-26 DIAGNOSIS — F411 Generalized anxiety disorder: Secondary | ICD-10-CM | POA: Diagnosis not present

## 2019-11-26 DIAGNOSIS — F902 Attention-deficit hyperactivity disorder, combined type: Secondary | ICD-10-CM | POA: Diagnosis not present

## 2019-11-26 DIAGNOSIS — F319 Bipolar disorder, unspecified: Secondary | ICD-10-CM | POA: Diagnosis not present

## 2019-11-27 DIAGNOSIS — F3132 Bipolar disorder, current episode depressed, moderate: Secondary | ICD-10-CM | POA: Diagnosis not present

## 2019-11-27 DIAGNOSIS — F902 Attention-deficit hyperactivity disorder, combined type: Secondary | ICD-10-CM | POA: Diagnosis not present

## 2019-11-27 DIAGNOSIS — F418 Other specified anxiety disorders: Secondary | ICD-10-CM | POA: Diagnosis not present

## 2019-11-28 DIAGNOSIS — F902 Attention-deficit hyperactivity disorder, combined type: Secondary | ICD-10-CM | POA: Diagnosis not present

## 2019-11-28 DIAGNOSIS — F3132 Bipolar disorder, current episode depressed, moderate: Secondary | ICD-10-CM | POA: Diagnosis not present

## 2019-11-28 DIAGNOSIS — Z30017 Encounter for initial prescription of implantable subdermal contraceptive: Secondary | ICD-10-CM | POA: Diagnosis not present

## 2019-12-03 DIAGNOSIS — F411 Generalized anxiety disorder: Secondary | ICD-10-CM | POA: Diagnosis not present

## 2019-12-03 DIAGNOSIS — F902 Attention-deficit hyperactivity disorder, combined type: Secondary | ICD-10-CM | POA: Diagnosis not present

## 2019-12-03 DIAGNOSIS — F319 Bipolar disorder, unspecified: Secondary | ICD-10-CM | POA: Diagnosis not present

## 2019-12-09 DIAGNOSIS — Z3046 Encounter for surveillance of implantable subdermal contraceptive: Secondary | ICD-10-CM | POA: Diagnosis not present

## 2019-12-10 DIAGNOSIS — F319 Bipolar disorder, unspecified: Secondary | ICD-10-CM | POA: Diagnosis not present

## 2019-12-10 DIAGNOSIS — F902 Attention-deficit hyperactivity disorder, combined type: Secondary | ICD-10-CM | POA: Diagnosis not present

## 2019-12-10 DIAGNOSIS — F411 Generalized anxiety disorder: Secondary | ICD-10-CM | POA: Diagnosis not present

## 2019-12-11 DIAGNOSIS — F3132 Bipolar disorder, current episode depressed, moderate: Secondary | ICD-10-CM | POA: Diagnosis not present

## 2019-12-11 DIAGNOSIS — F902 Attention-deficit hyperactivity disorder, combined type: Secondary | ICD-10-CM | POA: Diagnosis not present

## 2019-12-17 DIAGNOSIS — L700 Acne vulgaris: Secondary | ICD-10-CM | POA: Diagnosis not present

## 2019-12-17 DIAGNOSIS — F319 Bipolar disorder, unspecified: Secondary | ICD-10-CM | POA: Diagnosis not present

## 2019-12-17 DIAGNOSIS — F411 Generalized anxiety disorder: Secondary | ICD-10-CM | POA: Diagnosis not present

## 2019-12-17 DIAGNOSIS — F902 Attention-deficit hyperactivity disorder, combined type: Secondary | ICD-10-CM | POA: Diagnosis not present

## 2019-12-24 DIAGNOSIS — F902 Attention-deficit hyperactivity disorder, combined type: Secondary | ICD-10-CM | POA: Diagnosis not present

## 2019-12-24 DIAGNOSIS — F319 Bipolar disorder, unspecified: Secondary | ICD-10-CM | POA: Diagnosis not present

## 2019-12-24 DIAGNOSIS — F411 Generalized anxiety disorder: Secondary | ICD-10-CM | POA: Diagnosis not present

## 2019-12-25 DIAGNOSIS — F902 Attention-deficit hyperactivity disorder, combined type: Secondary | ICD-10-CM | POA: Diagnosis not present

## 2019-12-25 DIAGNOSIS — F418 Other specified anxiety disorders: Secondary | ICD-10-CM | POA: Diagnosis not present

## 2019-12-25 DIAGNOSIS — F3132 Bipolar disorder, current episode depressed, moderate: Secondary | ICD-10-CM | POA: Diagnosis not present

## 2019-12-31 DIAGNOSIS — F319 Bipolar disorder, unspecified: Secondary | ICD-10-CM | POA: Diagnosis not present

## 2019-12-31 DIAGNOSIS — F411 Generalized anxiety disorder: Secondary | ICD-10-CM | POA: Diagnosis not present

## 2019-12-31 DIAGNOSIS — F902 Attention-deficit hyperactivity disorder, combined type: Secondary | ICD-10-CM | POA: Diagnosis not present

## 2020-01-02 DIAGNOSIS — F902 Attention-deficit hyperactivity disorder, combined type: Secondary | ICD-10-CM | POA: Diagnosis not present

## 2020-01-02 DIAGNOSIS — F3132 Bipolar disorder, current episode depressed, moderate: Secondary | ICD-10-CM | POA: Diagnosis not present

## 2020-01-02 DIAGNOSIS — F418 Other specified anxiety disorders: Secondary | ICD-10-CM | POA: Diagnosis not present

## 2020-01-07 DIAGNOSIS — F902 Attention-deficit hyperactivity disorder, combined type: Secondary | ICD-10-CM | POA: Diagnosis not present

## 2020-01-07 DIAGNOSIS — F411 Generalized anxiety disorder: Secondary | ICD-10-CM | POA: Diagnosis not present

## 2020-01-07 DIAGNOSIS — F319 Bipolar disorder, unspecified: Secondary | ICD-10-CM | POA: Diagnosis not present

## 2020-01-14 DIAGNOSIS — F319 Bipolar disorder, unspecified: Secondary | ICD-10-CM | POA: Diagnosis not present

## 2020-01-14 DIAGNOSIS — F902 Attention-deficit hyperactivity disorder, combined type: Secondary | ICD-10-CM | POA: Diagnosis not present

## 2020-01-14 DIAGNOSIS — F411 Generalized anxiety disorder: Secondary | ICD-10-CM | POA: Diagnosis not present

## 2020-01-15 DIAGNOSIS — F3132 Bipolar disorder, current episode depressed, moderate: Secondary | ICD-10-CM | POA: Diagnosis not present

## 2020-01-15 DIAGNOSIS — F418 Other specified anxiety disorders: Secondary | ICD-10-CM | POA: Diagnosis not present

## 2020-01-15 DIAGNOSIS — F902 Attention-deficit hyperactivity disorder, combined type: Secondary | ICD-10-CM | POA: Diagnosis not present

## 2020-01-21 DIAGNOSIS — F319 Bipolar disorder, unspecified: Secondary | ICD-10-CM | POA: Diagnosis not present

## 2020-01-21 DIAGNOSIS — F902 Attention-deficit hyperactivity disorder, combined type: Secondary | ICD-10-CM | POA: Diagnosis not present

## 2020-01-21 DIAGNOSIS — F411 Generalized anxiety disorder: Secondary | ICD-10-CM | POA: Diagnosis not present

## 2020-01-28 DIAGNOSIS — F902 Attention-deficit hyperactivity disorder, combined type: Secondary | ICD-10-CM | POA: Diagnosis not present

## 2020-01-28 DIAGNOSIS — F319 Bipolar disorder, unspecified: Secondary | ICD-10-CM | POA: Diagnosis not present

## 2020-01-28 DIAGNOSIS — F411 Generalized anxiety disorder: Secondary | ICD-10-CM | POA: Diagnosis not present

## 2020-01-29 DIAGNOSIS — F5105 Insomnia due to other mental disorder: Secondary | ICD-10-CM | POA: Diagnosis not present

## 2020-01-29 DIAGNOSIS — F3176 Bipolar disorder, in full remission, most recent episode depressed: Secondary | ICD-10-CM | POA: Diagnosis not present

## 2020-01-29 DIAGNOSIS — F418 Other specified anxiety disorders: Secondary | ICD-10-CM | POA: Diagnosis not present

## 2020-01-29 DIAGNOSIS — F902 Attention-deficit hyperactivity disorder, combined type: Secondary | ICD-10-CM | POA: Diagnosis not present

## 2020-01-30 DIAGNOSIS — R7303 Prediabetes: Secondary | ICD-10-CM | POA: Diagnosis not present

## 2020-01-30 DIAGNOSIS — K3184 Gastroparesis: Secondary | ICD-10-CM | POA: Diagnosis not present

## 2020-01-30 DIAGNOSIS — R195 Other fecal abnormalities: Secondary | ICD-10-CM | POA: Diagnosis not present

## 2020-01-30 DIAGNOSIS — R1111 Vomiting without nausea: Secondary | ICD-10-CM | POA: Diagnosis not present

## 2020-02-04 DIAGNOSIS — F411 Generalized anxiety disorder: Secondary | ICD-10-CM | POA: Diagnosis not present

## 2020-02-04 DIAGNOSIS — F902 Attention-deficit hyperactivity disorder, combined type: Secondary | ICD-10-CM | POA: Diagnosis not present

## 2020-02-04 DIAGNOSIS — F319 Bipolar disorder, unspecified: Secondary | ICD-10-CM | POA: Diagnosis not present

## 2020-02-19 DIAGNOSIS — F419 Anxiety disorder, unspecified: Secondary | ICD-10-CM | POA: Diagnosis not present

## 2020-02-19 DIAGNOSIS — F3176 Bipolar disorder, in full remission, most recent episode depressed: Secondary | ICD-10-CM | POA: Diagnosis not present

## 2020-02-19 DIAGNOSIS — F5105 Insomnia due to other mental disorder: Secondary | ICD-10-CM | POA: Diagnosis not present

## 2020-02-19 DIAGNOSIS — F902 Attention-deficit hyperactivity disorder, combined type: Secondary | ICD-10-CM | POA: Diagnosis not present

## 2020-02-20 DIAGNOSIS — F411 Generalized anxiety disorder: Secondary | ICD-10-CM | POA: Diagnosis not present

## 2020-02-20 DIAGNOSIS — F319 Bipolar disorder, unspecified: Secondary | ICD-10-CM | POA: Diagnosis not present

## 2020-02-20 DIAGNOSIS — F902 Attention-deficit hyperactivity disorder, combined type: Secondary | ICD-10-CM | POA: Diagnosis not present

## 2020-02-25 DIAGNOSIS — F411 Generalized anxiety disorder: Secondary | ICD-10-CM | POA: Diagnosis not present

## 2020-02-25 DIAGNOSIS — F319 Bipolar disorder, unspecified: Secondary | ICD-10-CM | POA: Diagnosis not present

## 2020-02-25 DIAGNOSIS — F902 Attention-deficit hyperactivity disorder, combined type: Secondary | ICD-10-CM | POA: Diagnosis not present

## 2020-03-03 ENCOUNTER — Other Ambulatory Visit: Payer: Self-pay | Admitting: Endocrinology

## 2020-03-03 DIAGNOSIS — R7301 Impaired fasting glucose: Secondary | ICD-10-CM | POA: Diagnosis not present

## 2020-03-03 DIAGNOSIS — K3184 Gastroparesis: Secondary | ICD-10-CM | POA: Diagnosis not present

## 2020-03-03 DIAGNOSIS — E221 Hyperprolactinemia: Secondary | ICD-10-CM

## 2020-03-03 DIAGNOSIS — I10 Essential (primary) hypertension: Secondary | ICD-10-CM | POA: Diagnosis not present

## 2020-03-04 DIAGNOSIS — F3176 Bipolar disorder, in full remission, most recent episode depressed: Secondary | ICD-10-CM | POA: Diagnosis not present

## 2020-03-04 DIAGNOSIS — F902 Attention-deficit hyperactivity disorder, combined type: Secondary | ICD-10-CM | POA: Diagnosis not present

## 2020-03-04 DIAGNOSIS — F419 Anxiety disorder, unspecified: Secondary | ICD-10-CM | POA: Diagnosis not present

## 2020-03-04 DIAGNOSIS — F5105 Insomnia due to other mental disorder: Secondary | ICD-10-CM | POA: Diagnosis not present

## 2020-03-05 DIAGNOSIS — Z638 Other specified problems related to primary support group: Secondary | ICD-10-CM | POA: Diagnosis not present

## 2020-03-05 DIAGNOSIS — F902 Attention-deficit hyperactivity disorder, combined type: Secondary | ICD-10-CM | POA: Diagnosis not present

## 2020-03-05 DIAGNOSIS — F411 Generalized anxiety disorder: Secondary | ICD-10-CM | POA: Diagnosis not present

## 2020-03-05 DIAGNOSIS — F319 Bipolar disorder, unspecified: Secondary | ICD-10-CM | POA: Diagnosis not present

## 2020-03-10 DIAGNOSIS — F902 Attention-deficit hyperactivity disorder, combined type: Secondary | ICD-10-CM | POA: Diagnosis not present

## 2020-03-10 DIAGNOSIS — F319 Bipolar disorder, unspecified: Secondary | ICD-10-CM | POA: Diagnosis not present

## 2020-03-10 DIAGNOSIS — Z638 Other specified problems related to primary support group: Secondary | ICD-10-CM | POA: Diagnosis not present

## 2020-03-10 DIAGNOSIS — F411 Generalized anxiety disorder: Secondary | ICD-10-CM | POA: Diagnosis not present

## 2020-03-11 DIAGNOSIS — F419 Anxiety disorder, unspecified: Secondary | ICD-10-CM | POA: Diagnosis not present

## 2020-03-11 DIAGNOSIS — F902 Attention-deficit hyperactivity disorder, combined type: Secondary | ICD-10-CM | POA: Diagnosis not present

## 2020-03-11 DIAGNOSIS — F5105 Insomnia due to other mental disorder: Secondary | ICD-10-CM | POA: Diagnosis not present

## 2020-03-11 DIAGNOSIS — F3176 Bipolar disorder, in full remission, most recent episode depressed: Secondary | ICD-10-CM | POA: Diagnosis not present

## 2020-03-17 DIAGNOSIS — F419 Anxiety disorder, unspecified: Secondary | ICD-10-CM | POA: Diagnosis not present

## 2020-03-17 DIAGNOSIS — R7302 Impaired glucose tolerance (oral): Secondary | ICD-10-CM | POA: Diagnosis not present

## 2020-03-17 DIAGNOSIS — F319 Bipolar disorder, unspecified: Secondary | ICD-10-CM | POA: Diagnosis not present

## 2020-03-17 DIAGNOSIS — F902 Attention-deficit hyperactivity disorder, combined type: Secondary | ICD-10-CM | POA: Diagnosis not present

## 2020-03-17 DIAGNOSIS — Z638 Other specified problems related to primary support group: Secondary | ICD-10-CM | POA: Diagnosis not present

## 2020-03-17 DIAGNOSIS — F909 Attention-deficit hyperactivity disorder, unspecified type: Secondary | ICD-10-CM | POA: Diagnosis not present

## 2020-03-17 DIAGNOSIS — F411 Generalized anxiety disorder: Secondary | ICD-10-CM | POA: Diagnosis not present

## 2020-03-17 DIAGNOSIS — I10 Essential (primary) hypertension: Secondary | ICD-10-CM | POA: Diagnosis not present

## 2020-03-24 ENCOUNTER — Other Ambulatory Visit: Payer: BC Managed Care – PPO

## 2020-03-25 DIAGNOSIS — F840 Autistic disorder: Secondary | ICD-10-CM | POA: Diagnosis not present

## 2020-03-25 DIAGNOSIS — F902 Attention-deficit hyperactivity disorder, combined type: Secondary | ICD-10-CM | POA: Diagnosis not present

## 2020-03-25 DIAGNOSIS — F419 Anxiety disorder, unspecified: Secondary | ICD-10-CM | POA: Diagnosis not present

## 2020-03-25 DIAGNOSIS — F3176 Bipolar disorder, in full remission, most recent episode depressed: Secondary | ICD-10-CM | POA: Diagnosis not present

## 2020-03-26 ENCOUNTER — Other Ambulatory Visit: Payer: Self-pay | Admitting: Gastroenterology

## 2020-03-26 ENCOUNTER — Other Ambulatory Visit (HOSPITAL_COMMUNITY): Payer: Self-pay | Admitting: Gastroenterology

## 2020-03-26 DIAGNOSIS — R1110 Vomiting, unspecified: Secondary | ICD-10-CM | POA: Diagnosis not present

## 2020-03-26 DIAGNOSIS — R197 Diarrhea, unspecified: Secondary | ICD-10-CM | POA: Diagnosis not present

## 2020-03-26 DIAGNOSIS — R1084 Generalized abdominal pain: Secondary | ICD-10-CM | POA: Diagnosis not present

## 2020-03-31 DIAGNOSIS — Z638 Other specified problems related to primary support group: Secondary | ICD-10-CM | POA: Diagnosis not present

## 2020-03-31 DIAGNOSIS — F319 Bipolar disorder, unspecified: Secondary | ICD-10-CM | POA: Diagnosis not present

## 2020-03-31 DIAGNOSIS — F411 Generalized anxiety disorder: Secondary | ICD-10-CM | POA: Diagnosis not present

## 2020-03-31 DIAGNOSIS — F902 Attention-deficit hyperactivity disorder, combined type: Secondary | ICD-10-CM | POA: Diagnosis not present

## 2020-04-08 DIAGNOSIS — F3176 Bipolar disorder, in full remission, most recent episode depressed: Secondary | ICD-10-CM | POA: Diagnosis not present

## 2020-04-08 DIAGNOSIS — F902 Attention-deficit hyperactivity disorder, combined type: Secondary | ICD-10-CM | POA: Diagnosis not present

## 2020-04-08 DIAGNOSIS — F5105 Insomnia due to other mental disorder: Secondary | ICD-10-CM | POA: Diagnosis not present

## 2020-04-08 DIAGNOSIS — F419 Anxiety disorder, unspecified: Secondary | ICD-10-CM | POA: Diagnosis not present

## 2020-04-09 DIAGNOSIS — F902 Attention-deficit hyperactivity disorder, combined type: Secondary | ICD-10-CM | POA: Diagnosis not present

## 2020-04-09 DIAGNOSIS — Z638 Other specified problems related to primary support group: Secondary | ICD-10-CM | POA: Diagnosis not present

## 2020-04-09 DIAGNOSIS — F319 Bipolar disorder, unspecified: Secondary | ICD-10-CM | POA: Diagnosis not present

## 2020-04-09 DIAGNOSIS — F411 Generalized anxiety disorder: Secondary | ICD-10-CM | POA: Diagnosis not present

## 2020-04-10 DIAGNOSIS — R7303 Prediabetes: Secondary | ICD-10-CM | POA: Diagnosis not present

## 2020-04-13 ENCOUNTER — Ambulatory Visit (HOSPITAL_COMMUNITY): Admission: RE | Admit: 2020-04-13 | Payer: BC Managed Care – PPO | Source: Ambulatory Visit

## 2020-04-13 ENCOUNTER — Encounter (HOSPITAL_COMMUNITY): Payer: Self-pay

## 2020-04-14 DIAGNOSIS — Z638 Other specified problems related to primary support group: Secondary | ICD-10-CM | POA: Diagnosis not present

## 2020-04-14 DIAGNOSIS — F411 Generalized anxiety disorder: Secondary | ICD-10-CM | POA: Diagnosis not present

## 2020-04-14 DIAGNOSIS — F902 Attention-deficit hyperactivity disorder, combined type: Secondary | ICD-10-CM | POA: Diagnosis not present

## 2020-04-14 DIAGNOSIS — F319 Bipolar disorder, unspecified: Secondary | ICD-10-CM | POA: Diagnosis not present

## 2020-04-21 DIAGNOSIS — F411 Generalized anxiety disorder: Secondary | ICD-10-CM | POA: Diagnosis not present

## 2020-04-21 DIAGNOSIS — Z735 Social role conflict, not elsewhere classified: Secondary | ICD-10-CM | POA: Diagnosis not present

## 2020-04-21 DIAGNOSIS — F319 Bipolar disorder, unspecified: Secondary | ICD-10-CM | POA: Diagnosis not present

## 2020-04-21 DIAGNOSIS — F902 Attention-deficit hyperactivity disorder, combined type: Secondary | ICD-10-CM | POA: Diagnosis not present

## 2020-04-28 DIAGNOSIS — F319 Bipolar disorder, unspecified: Secondary | ICD-10-CM | POA: Diagnosis not present

## 2020-04-28 DIAGNOSIS — F902 Attention-deficit hyperactivity disorder, combined type: Secondary | ICD-10-CM | POA: Diagnosis not present

## 2020-04-28 DIAGNOSIS — Z735 Social role conflict, not elsewhere classified: Secondary | ICD-10-CM | POA: Diagnosis not present

## 2020-04-28 DIAGNOSIS — F411 Generalized anxiety disorder: Secondary | ICD-10-CM | POA: Diagnosis not present

## 2020-04-29 DIAGNOSIS — F902 Attention-deficit hyperactivity disorder, combined type: Secondary | ICD-10-CM | POA: Diagnosis not present

## 2020-04-29 DIAGNOSIS — F419 Anxiety disorder, unspecified: Secondary | ICD-10-CM | POA: Diagnosis not present

## 2020-04-29 DIAGNOSIS — F3176 Bipolar disorder, in full remission, most recent episode depressed: Secondary | ICD-10-CM | POA: Diagnosis not present

## 2020-04-29 DIAGNOSIS — F5105 Insomnia due to other mental disorder: Secondary | ICD-10-CM | POA: Diagnosis not present

## 2020-05-05 DIAGNOSIS — Z638 Other specified problems related to primary support group: Secondary | ICD-10-CM | POA: Diagnosis not present

## 2020-05-05 DIAGNOSIS — F411 Generalized anxiety disorder: Secondary | ICD-10-CM | POA: Diagnosis not present

## 2020-05-05 DIAGNOSIS — F902 Attention-deficit hyperactivity disorder, combined type: Secondary | ICD-10-CM | POA: Diagnosis not present

## 2020-05-05 DIAGNOSIS — F319 Bipolar disorder, unspecified: Secondary | ICD-10-CM | POA: Diagnosis not present

## 2020-05-13 DIAGNOSIS — Z1159 Encounter for screening for other viral diseases: Secondary | ICD-10-CM | POA: Diagnosis not present

## 2020-05-14 DIAGNOSIS — D124 Benign neoplasm of descending colon: Secondary | ICD-10-CM | POA: Diagnosis not present

## 2020-05-14 DIAGNOSIS — D120 Benign neoplasm of cecum: Secondary | ICD-10-CM | POA: Diagnosis not present

## 2020-05-14 DIAGNOSIS — R197 Diarrhea, unspecified: Secondary | ICD-10-CM | POA: Diagnosis not present

## 2020-05-14 DIAGNOSIS — R1084 Generalized abdominal pain: Secondary | ICD-10-CM | POA: Diagnosis not present

## 2020-05-14 DIAGNOSIS — K640 First degree hemorrhoids: Secondary | ICD-10-CM | POA: Diagnosis not present

## 2020-05-19 DIAGNOSIS — Z638 Other specified problems related to primary support group: Secondary | ICD-10-CM | POA: Diagnosis not present

## 2020-05-19 DIAGNOSIS — F902 Attention-deficit hyperactivity disorder, combined type: Secondary | ICD-10-CM | POA: Diagnosis not present

## 2020-05-19 DIAGNOSIS — F319 Bipolar disorder, unspecified: Secondary | ICD-10-CM | POA: Diagnosis not present

## 2020-05-19 DIAGNOSIS — F411 Generalized anxiety disorder: Secondary | ICD-10-CM | POA: Diagnosis not present

## 2020-05-20 DIAGNOSIS — F902 Attention-deficit hyperactivity disorder, combined type: Secondary | ICD-10-CM | POA: Diagnosis not present

## 2020-05-20 DIAGNOSIS — F419 Anxiety disorder, unspecified: Secondary | ICD-10-CM | POA: Diagnosis not present

## 2020-05-20 DIAGNOSIS — F5105 Insomnia due to other mental disorder: Secondary | ICD-10-CM | POA: Diagnosis not present

## 2020-05-20 DIAGNOSIS — F3176 Bipolar disorder, in full remission, most recent episode depressed: Secondary | ICD-10-CM | POA: Diagnosis not present

## 2020-06-02 DIAGNOSIS — F319 Bipolar disorder, unspecified: Secondary | ICD-10-CM | POA: Diagnosis not present

## 2020-06-02 DIAGNOSIS — Z638 Other specified problems related to primary support group: Secondary | ICD-10-CM | POA: Diagnosis not present

## 2020-06-02 DIAGNOSIS — F902 Attention-deficit hyperactivity disorder, combined type: Secondary | ICD-10-CM | POA: Diagnosis not present

## 2020-06-02 DIAGNOSIS — F411 Generalized anxiety disorder: Secondary | ICD-10-CM | POA: Diagnosis not present

## 2020-06-10 DIAGNOSIS — F5105 Insomnia due to other mental disorder: Secondary | ICD-10-CM | POA: Diagnosis not present

## 2020-06-10 DIAGNOSIS — F902 Attention-deficit hyperactivity disorder, combined type: Secondary | ICD-10-CM | POA: Diagnosis not present

## 2020-06-10 DIAGNOSIS — F3176 Bipolar disorder, in full remission, most recent episode depressed: Secondary | ICD-10-CM | POA: Diagnosis not present

## 2020-06-10 DIAGNOSIS — F419 Anxiety disorder, unspecified: Secondary | ICD-10-CM | POA: Diagnosis not present

## 2020-06-16 DIAGNOSIS — F902 Attention-deficit hyperactivity disorder, combined type: Secondary | ICD-10-CM | POA: Diagnosis not present

## 2020-06-16 DIAGNOSIS — Z638 Other specified problems related to primary support group: Secondary | ICD-10-CM | POA: Diagnosis not present

## 2020-06-16 DIAGNOSIS — F411 Generalized anxiety disorder: Secondary | ICD-10-CM | POA: Diagnosis not present

## 2020-06-16 DIAGNOSIS — F319 Bipolar disorder, unspecified: Secondary | ICD-10-CM | POA: Diagnosis not present

## 2020-06-23 DIAGNOSIS — F319 Bipolar disorder, unspecified: Secondary | ICD-10-CM | POA: Diagnosis not present

## 2020-06-23 DIAGNOSIS — Z638 Other specified problems related to primary support group: Secondary | ICD-10-CM | POA: Diagnosis not present

## 2020-06-23 DIAGNOSIS — F902 Attention-deficit hyperactivity disorder, combined type: Secondary | ICD-10-CM | POA: Diagnosis not present

## 2020-06-23 DIAGNOSIS — F411 Generalized anxiety disorder: Secondary | ICD-10-CM | POA: Diagnosis not present

## 2020-06-30 DIAGNOSIS — F411 Generalized anxiety disorder: Secondary | ICD-10-CM | POA: Diagnosis not present

## 2020-06-30 DIAGNOSIS — F902 Attention-deficit hyperactivity disorder, combined type: Secondary | ICD-10-CM | POA: Diagnosis not present

## 2020-06-30 DIAGNOSIS — Z638 Other specified problems related to primary support group: Secondary | ICD-10-CM | POA: Diagnosis not present

## 2020-06-30 DIAGNOSIS — F319 Bipolar disorder, unspecified: Secondary | ICD-10-CM | POA: Diagnosis not present

## 2020-07-01 DIAGNOSIS — F902 Attention-deficit hyperactivity disorder, combined type: Secondary | ICD-10-CM | POA: Diagnosis not present

## 2020-07-01 DIAGNOSIS — F3176 Bipolar disorder, in full remission, most recent episode depressed: Secondary | ICD-10-CM | POA: Diagnosis not present

## 2020-07-01 DIAGNOSIS — F5105 Insomnia due to other mental disorder: Secondary | ICD-10-CM | POA: Diagnosis not present

## 2020-07-01 DIAGNOSIS — F419 Anxiety disorder, unspecified: Secondary | ICD-10-CM | POA: Diagnosis not present

## 2020-07-07 DIAGNOSIS — F411 Generalized anxiety disorder: Secondary | ICD-10-CM | POA: Diagnosis not present

## 2020-07-07 DIAGNOSIS — F902 Attention-deficit hyperactivity disorder, combined type: Secondary | ICD-10-CM | POA: Diagnosis not present

## 2020-07-07 DIAGNOSIS — Z638 Other specified problems related to primary support group: Secondary | ICD-10-CM | POA: Diagnosis not present

## 2020-07-07 DIAGNOSIS — F319 Bipolar disorder, unspecified: Secondary | ICD-10-CM | POA: Diagnosis not present

## 2020-07-14 DIAGNOSIS — F411 Generalized anxiety disorder: Secondary | ICD-10-CM | POA: Diagnosis not present

## 2020-07-14 DIAGNOSIS — F902 Attention-deficit hyperactivity disorder, combined type: Secondary | ICD-10-CM | POA: Diagnosis not present

## 2020-07-14 DIAGNOSIS — Z735 Social role conflict, not elsewhere classified: Secondary | ICD-10-CM | POA: Diagnosis not present

## 2020-07-14 DIAGNOSIS — F319 Bipolar disorder, unspecified: Secondary | ICD-10-CM | POA: Diagnosis not present

## 2020-07-15 DIAGNOSIS — F5105 Insomnia due to other mental disorder: Secondary | ICD-10-CM | POA: Diagnosis not present

## 2020-07-15 DIAGNOSIS — F902 Attention-deficit hyperactivity disorder, combined type: Secondary | ICD-10-CM | POA: Diagnosis not present

## 2020-07-15 DIAGNOSIS — F319 Bipolar disorder, unspecified: Secondary | ICD-10-CM | POA: Diagnosis not present

## 2020-07-15 DIAGNOSIS — F419 Anxiety disorder, unspecified: Secondary | ICD-10-CM | POA: Diagnosis not present

## 2020-07-22 DIAGNOSIS — F902 Attention-deficit hyperactivity disorder, combined type: Secondary | ICD-10-CM | POA: Diagnosis not present

## 2020-07-22 DIAGNOSIS — F319 Bipolar disorder, unspecified: Secondary | ICD-10-CM | POA: Diagnosis not present

## 2020-07-22 DIAGNOSIS — F909 Attention-deficit hyperactivity disorder, unspecified type: Secondary | ICD-10-CM | POA: Diagnosis not present

## 2020-07-22 DIAGNOSIS — I10 Essential (primary) hypertension: Secondary | ICD-10-CM | POA: Diagnosis not present

## 2020-07-22 DIAGNOSIS — F5105 Insomnia due to other mental disorder: Secondary | ICD-10-CM | POA: Diagnosis not present

## 2020-07-22 DIAGNOSIS — R7302 Impaired glucose tolerance (oral): Secondary | ICD-10-CM | POA: Diagnosis not present

## 2020-07-22 DIAGNOSIS — F419 Anxiety disorder, unspecified: Secondary | ICD-10-CM | POA: Diagnosis not present

## 2020-07-29 DIAGNOSIS — F902 Attention-deficit hyperactivity disorder, combined type: Secondary | ICD-10-CM | POA: Diagnosis not present

## 2020-07-29 DIAGNOSIS — F411 Generalized anxiety disorder: Secondary | ICD-10-CM | POA: Diagnosis not present

## 2020-07-29 DIAGNOSIS — F319 Bipolar disorder, unspecified: Secondary | ICD-10-CM | POA: Diagnosis not present

## 2020-07-29 DIAGNOSIS — Z735 Social role conflict, not elsewhere classified: Secondary | ICD-10-CM | POA: Diagnosis not present

## 2020-08-01 IMAGING — US US BREAST*L* LIMITED INC AXILLA
1 series · 11 of 11 positions shown · non-contrast
Comparison: None.

CLINICAL DATA: Patient describes a palpable lump in the subareolar
region of the LEFT breast for approximately 2 weeks, decreased in
size status post a recent course of antibiotics and the associated
pain has resolved.

EXAM:
ULTRASOUND OF THE LEFT BREAST

[Series 1: us breast*left* limited inc axilla · 0.05mm/px · 11 of 11 slices shown]
[im 1/11]
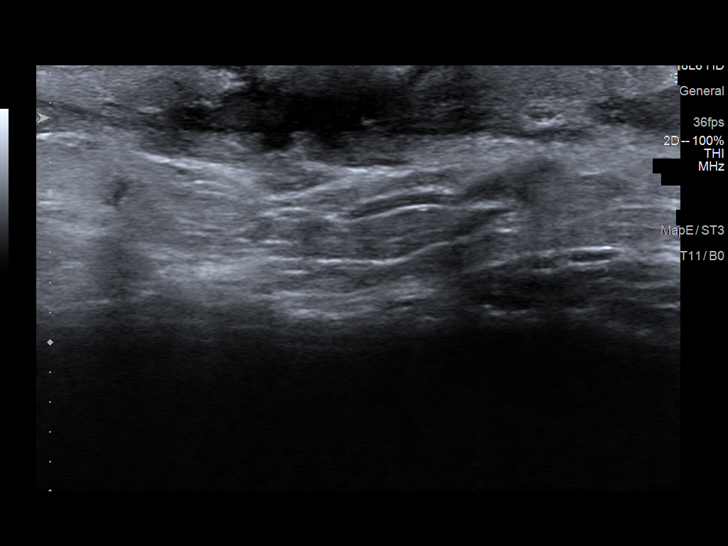
[im 2/11]
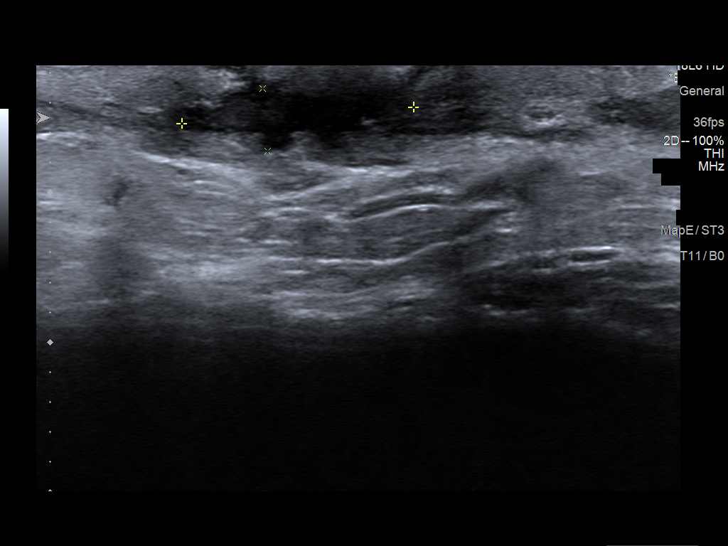
[im 3/11]
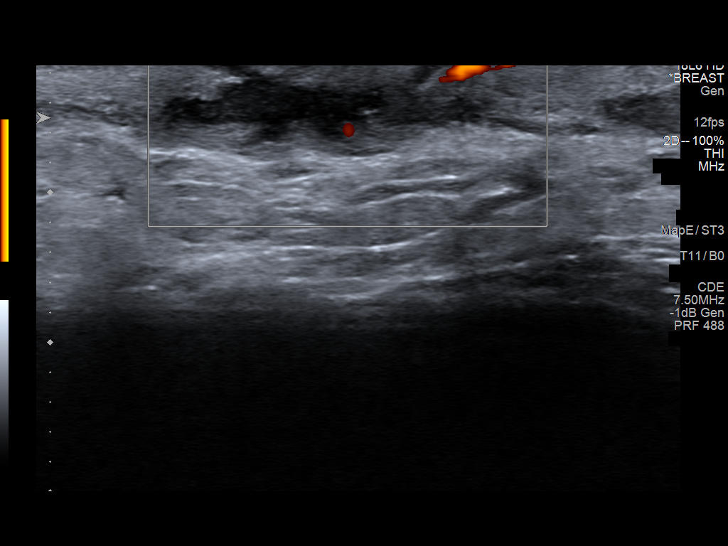
[im 4/11]
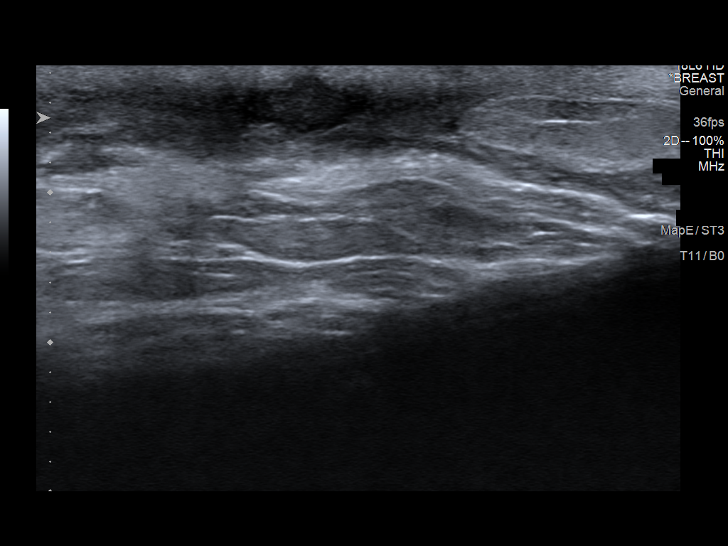
[im 5/11]
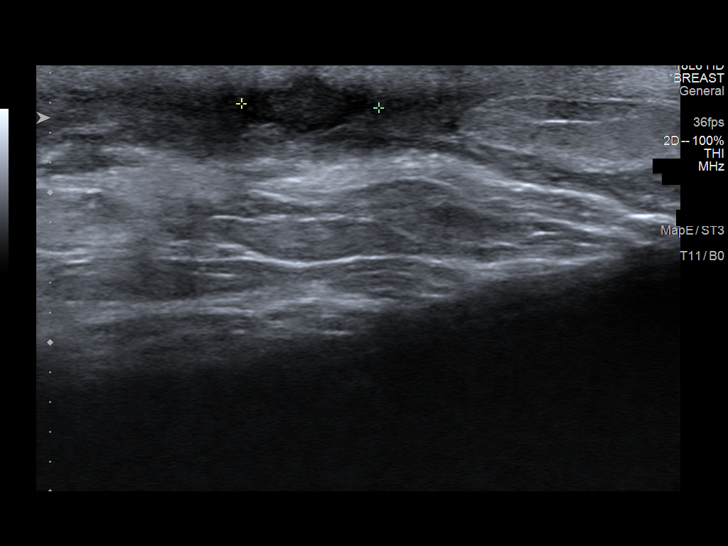
[im 6/11]
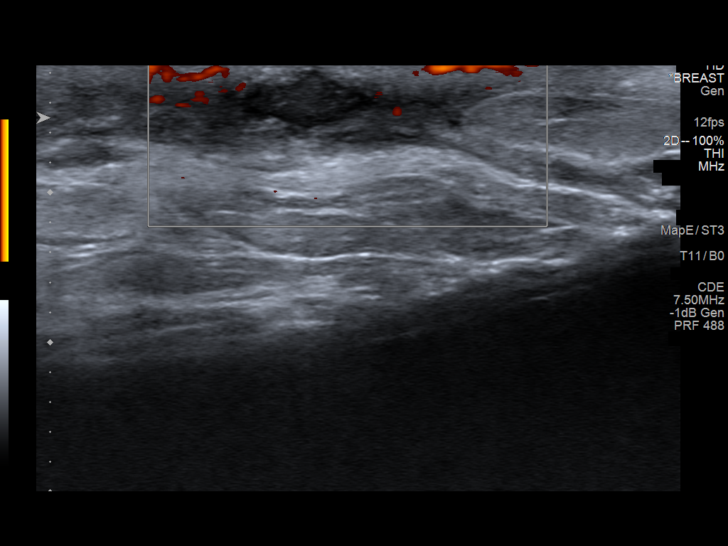
[im 7/11]
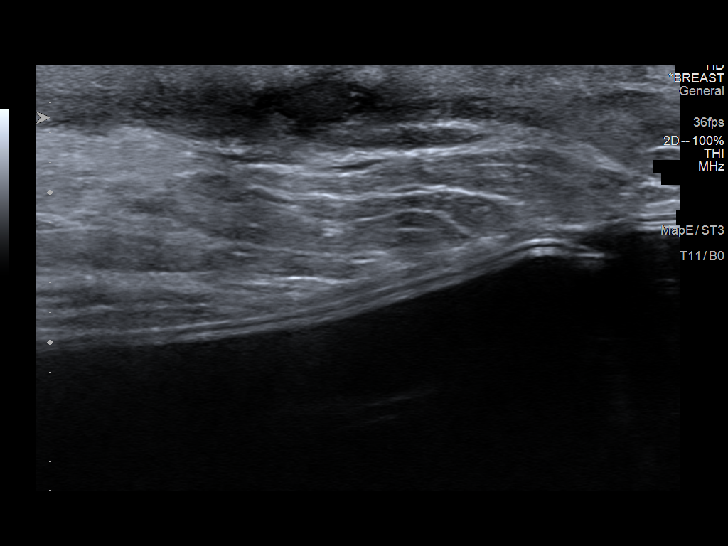
[im 8/11]
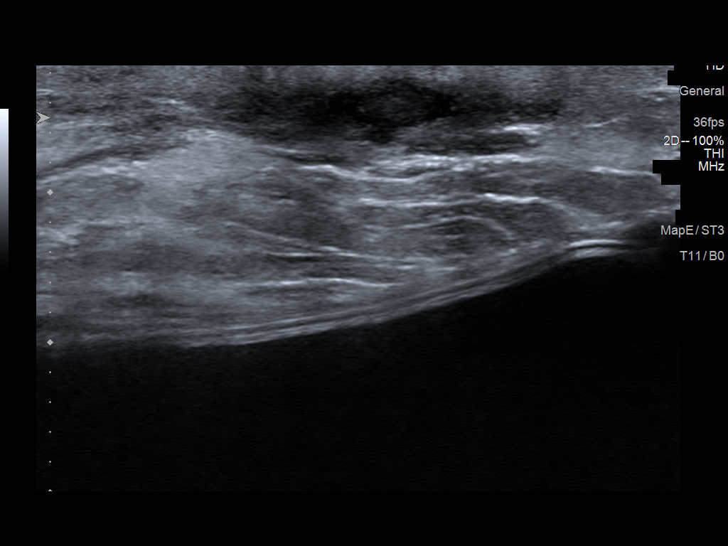
[im 9/11]
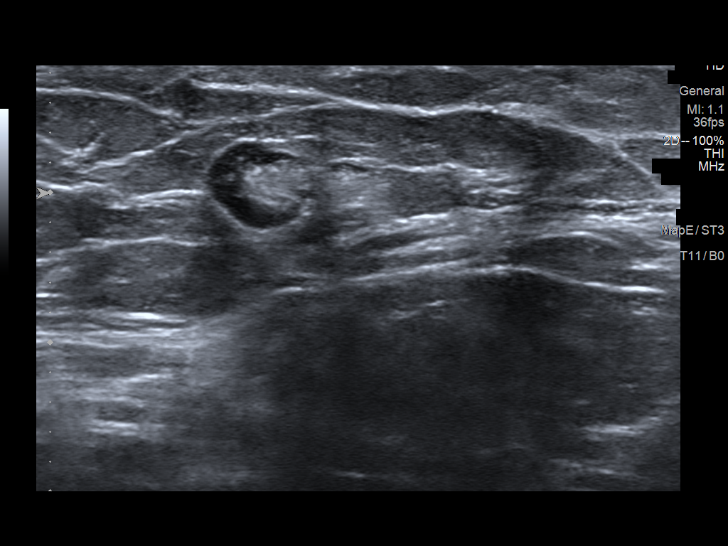
[im 10/11]
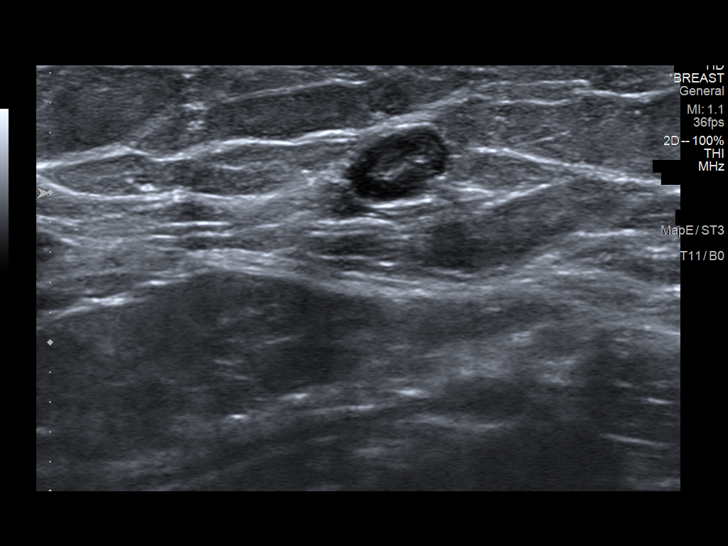
[im 11/11]
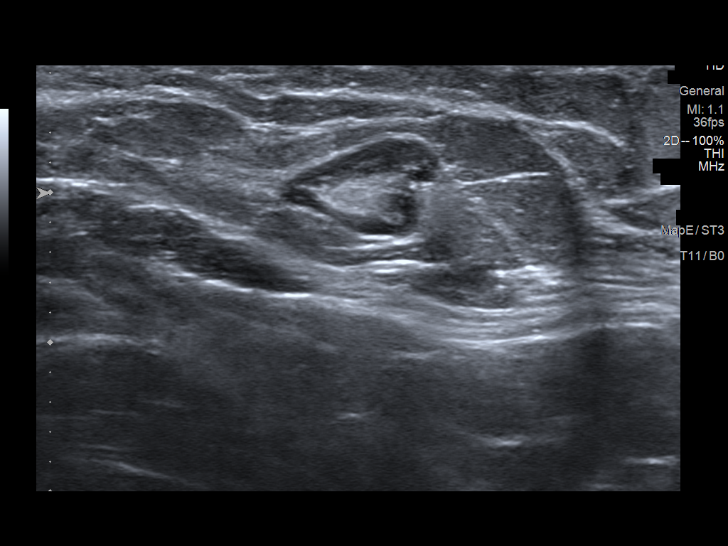

[11 of 11 positions shown; findings below may reference images not displayed]

FINDINGS: Targeted ultrasound is performed, evaluating the subareolar and
periareolar LEFT breast, showing a thin hypoechoic collection within
the skin the LEFT nipple, measuring 1.6 cm in length and 4 mm in
thickness, corresponding to the area of concern. No extension into
the underlying breast tissues.
IMPRESSION: 1. Thin complex hypoechoic collection localized to the skin of the
LEFT nipple, compatible with complicated sebaceous cyst versus
obstructed [REDACTED] gland, presumably with recent secondary
infection and/or inflammation, improved per patient status post a
course of Keflex. Patient will be given an additional course of
clindamycin with repeat ultrasound in 2 weeks.
2. No drainable fluid collection/abscess within the underlying
breast tissues.

RECOMMENDATION:
1. Patient provided with an additional course of antibiotics
(clindamycin).
2. Patient is scheduled for follow-up LEFT breast ultrasound in 2
weeks.
3. Patient was instructed to return sooner if the palpable findings
increased, pain returned or LEFT breast skin redness developed prior
to the scheduled follow-up ultrasound.

I have discussed the findings and recommendations with the patient.
If applicable, a reminder letter will be sent to the patient
regarding the next appointment.

BI-RADS CATEGORY  2: Benign.

## 2020-08-05 DIAGNOSIS — Z638 Other specified problems related to primary support group: Secondary | ICD-10-CM | POA: Diagnosis not present

## 2020-08-05 DIAGNOSIS — F419 Anxiety disorder, unspecified: Secondary | ICD-10-CM | POA: Diagnosis not present

## 2020-08-05 DIAGNOSIS — F902 Attention-deficit hyperactivity disorder, combined type: Secondary | ICD-10-CM | POA: Diagnosis not present

## 2020-08-05 DIAGNOSIS — F411 Generalized anxiety disorder: Secondary | ICD-10-CM | POA: Diagnosis not present

## 2020-08-05 DIAGNOSIS — F410 Panic disorder [episodic paroxysmal anxiety] without agoraphobia: Secondary | ICD-10-CM | POA: Diagnosis not present

## 2020-08-05 DIAGNOSIS — F319 Bipolar disorder, unspecified: Secondary | ICD-10-CM | POA: Diagnosis not present

## 2020-08-05 DIAGNOSIS — G4700 Insomnia, unspecified: Secondary | ICD-10-CM | POA: Diagnosis not present

## 2020-08-05 DIAGNOSIS — F3175 Bipolar disorder, in partial remission, most recent episode depressed: Secondary | ICD-10-CM | POA: Diagnosis not present

## 2020-08-12 DIAGNOSIS — F319 Bipolar disorder, unspecified: Secondary | ICD-10-CM | POA: Diagnosis not present

## 2020-08-12 DIAGNOSIS — F902 Attention-deficit hyperactivity disorder, combined type: Secondary | ICD-10-CM | POA: Diagnosis not present

## 2020-08-12 DIAGNOSIS — Z735 Social role conflict, not elsewhere classified: Secondary | ICD-10-CM | POA: Diagnosis not present

## 2020-08-12 DIAGNOSIS — F411 Generalized anxiety disorder: Secondary | ICD-10-CM | POA: Diagnosis not present

## 2020-08-19 DIAGNOSIS — F411 Generalized anxiety disorder: Secondary | ICD-10-CM | POA: Diagnosis not present

## 2020-08-19 DIAGNOSIS — Z638 Other specified problems related to primary support group: Secondary | ICD-10-CM | POA: Diagnosis not present

## 2020-08-19 DIAGNOSIS — F319 Bipolar disorder, unspecified: Secondary | ICD-10-CM | POA: Diagnosis not present

## 2020-08-19 DIAGNOSIS — F902 Attention-deficit hyperactivity disorder, combined type: Secondary | ICD-10-CM | POA: Diagnosis not present

## 2020-08-20 DIAGNOSIS — F419 Anxiety disorder, unspecified: Secondary | ICD-10-CM | POA: Diagnosis not present

## 2020-08-20 DIAGNOSIS — F3175 Bipolar disorder, in partial remission, most recent episode depressed: Secondary | ICD-10-CM | POA: Diagnosis not present

## 2020-08-20 DIAGNOSIS — G4700 Insomnia, unspecified: Secondary | ICD-10-CM | POA: Diagnosis not present

## 2020-08-20 DIAGNOSIS — F410 Panic disorder [episodic paroxysmal anxiety] without agoraphobia: Secondary | ICD-10-CM | POA: Diagnosis not present

## 2020-09-09 DIAGNOSIS — F410 Panic disorder [episodic paroxysmal anxiety] without agoraphobia: Secondary | ICD-10-CM | POA: Diagnosis not present

## 2020-09-09 DIAGNOSIS — F419 Anxiety disorder, unspecified: Secondary | ICD-10-CM | POA: Diagnosis not present

## 2020-09-09 DIAGNOSIS — F840 Autistic disorder: Secondary | ICD-10-CM | POA: Diagnosis not present

## 2020-09-09 DIAGNOSIS — F3175 Bipolar disorder, in partial remission, most recent episode depressed: Secondary | ICD-10-CM | POA: Diagnosis not present

## 2020-09-10 DIAGNOSIS — F419 Anxiety disorder, unspecified: Secondary | ICD-10-CM | POA: Diagnosis not present

## 2020-09-10 DIAGNOSIS — F410 Panic disorder [episodic paroxysmal anxiety] without agoraphobia: Secondary | ICD-10-CM | POA: Diagnosis not present

## 2020-09-10 DIAGNOSIS — R413 Other amnesia: Secondary | ICD-10-CM | POA: Diagnosis not present

## 2020-09-10 DIAGNOSIS — F3175 Bipolar disorder, in partial remission, most recent episode depressed: Secondary | ICD-10-CM | POA: Diagnosis not present

## 2020-09-16 DIAGNOSIS — F840 Autistic disorder: Secondary | ICD-10-CM | POA: Diagnosis not present

## 2020-09-16 DIAGNOSIS — F3175 Bipolar disorder, in partial remission, most recent episode depressed: Secondary | ICD-10-CM | POA: Diagnosis not present

## 2020-09-16 DIAGNOSIS — F419 Anxiety disorder, unspecified: Secondary | ICD-10-CM | POA: Diagnosis not present

## 2020-09-16 DIAGNOSIS — F410 Panic disorder [episodic paroxysmal anxiety] without agoraphobia: Secondary | ICD-10-CM | POA: Diagnosis not present

## 2020-09-24 DIAGNOSIS — F410 Panic disorder [episodic paroxysmal anxiety] without agoraphobia: Secondary | ICD-10-CM | POA: Diagnosis not present

## 2020-09-24 DIAGNOSIS — F3175 Bipolar disorder, in partial remission, most recent episode depressed: Secondary | ICD-10-CM | POA: Diagnosis not present

## 2020-09-24 DIAGNOSIS — F419 Anxiety disorder, unspecified: Secondary | ICD-10-CM | POA: Diagnosis not present

## 2020-09-24 DIAGNOSIS — R413 Other amnesia: Secondary | ICD-10-CM | POA: Diagnosis not present

## 2020-09-30 DIAGNOSIS — F3175 Bipolar disorder, in partial remission, most recent episode depressed: Secondary | ICD-10-CM | POA: Diagnosis not present

## 2020-09-30 DIAGNOSIS — F840 Autistic disorder: Secondary | ICD-10-CM | POA: Diagnosis not present

## 2020-09-30 DIAGNOSIS — F419 Anxiety disorder, unspecified: Secondary | ICD-10-CM | POA: Diagnosis not present

## 2020-09-30 DIAGNOSIS — F410 Panic disorder [episodic paroxysmal anxiety] without agoraphobia: Secondary | ICD-10-CM | POA: Diagnosis not present

## 2020-10-08 DIAGNOSIS — F419 Anxiety disorder, unspecified: Secondary | ICD-10-CM | POA: Diagnosis not present

## 2020-10-08 DIAGNOSIS — E221 Hyperprolactinemia: Secondary | ICD-10-CM | POA: Diagnosis not present

## 2020-10-08 DIAGNOSIS — F410 Panic disorder [episodic paroxysmal anxiety] without agoraphobia: Secondary | ICD-10-CM | POA: Diagnosis not present

## 2020-10-08 DIAGNOSIS — R413 Other amnesia: Secondary | ICD-10-CM | POA: Diagnosis not present

## 2020-10-09 DIAGNOSIS — F840 Autistic disorder: Secondary | ICD-10-CM | POA: Diagnosis not present

## 2020-10-09 DIAGNOSIS — F410 Panic disorder [episodic paroxysmal anxiety] without agoraphobia: Secondary | ICD-10-CM | POA: Diagnosis not present

## 2020-10-09 DIAGNOSIS — F3175 Bipolar disorder, in partial remission, most recent episode depressed: Secondary | ICD-10-CM | POA: Diagnosis not present

## 2020-10-09 DIAGNOSIS — F419 Anxiety disorder, unspecified: Secondary | ICD-10-CM | POA: Diagnosis not present

## 2020-10-14 DIAGNOSIS — F840 Autistic disorder: Secondary | ICD-10-CM | POA: Diagnosis not present

## 2020-10-14 DIAGNOSIS — F410 Panic disorder [episodic paroxysmal anxiety] without agoraphobia: Secondary | ICD-10-CM | POA: Diagnosis not present

## 2020-10-14 DIAGNOSIS — F419 Anxiety disorder, unspecified: Secondary | ICD-10-CM | POA: Diagnosis not present

## 2020-10-14 DIAGNOSIS — F3175 Bipolar disorder, in partial remission, most recent episode depressed: Secondary | ICD-10-CM | POA: Diagnosis not present

## 2020-10-21 DIAGNOSIS — F840 Autistic disorder: Secondary | ICD-10-CM | POA: Diagnosis not present

## 2020-10-21 DIAGNOSIS — F3175 Bipolar disorder, in partial remission, most recent episode depressed: Secondary | ICD-10-CM | POA: Diagnosis not present

## 2020-10-21 DIAGNOSIS — F410 Panic disorder [episodic paroxysmal anxiety] without agoraphobia: Secondary | ICD-10-CM | POA: Diagnosis not present

## 2020-10-21 DIAGNOSIS — F419 Anxiety disorder, unspecified: Secondary | ICD-10-CM | POA: Diagnosis not present

## 2020-10-22 DIAGNOSIS — E221 Hyperprolactinemia: Secondary | ICD-10-CM | POA: Diagnosis not present

## 2020-10-22 DIAGNOSIS — F419 Anxiety disorder, unspecified: Secondary | ICD-10-CM | POA: Diagnosis not present

## 2020-10-22 DIAGNOSIS — F410 Panic disorder [episodic paroxysmal anxiety] without agoraphobia: Secondary | ICD-10-CM | POA: Diagnosis not present

## 2020-10-22 DIAGNOSIS — R413 Other amnesia: Secondary | ICD-10-CM | POA: Diagnosis not present

## 2020-10-26 DIAGNOSIS — R7302 Impaired glucose tolerance (oral): Secondary | ICD-10-CM | POA: Diagnosis not present

## 2020-10-26 DIAGNOSIS — I10 Essential (primary) hypertension: Secondary | ICD-10-CM | POA: Diagnosis not present

## 2020-10-26 DIAGNOSIS — F39 Unspecified mood [affective] disorder: Secondary | ICD-10-CM | POA: Diagnosis not present

## 2020-10-26 DIAGNOSIS — F909 Attention-deficit hyperactivity disorder, unspecified type: Secondary | ICD-10-CM | POA: Diagnosis not present

## 2020-10-28 DIAGNOSIS — F410 Panic disorder [episodic paroxysmal anxiety] without agoraphobia: Secondary | ICD-10-CM | POA: Diagnosis not present

## 2020-10-28 DIAGNOSIS — F3175 Bipolar disorder, in partial remission, most recent episode depressed: Secondary | ICD-10-CM | POA: Diagnosis not present

## 2020-10-28 DIAGNOSIS — F840 Autistic disorder: Secondary | ICD-10-CM | POA: Diagnosis not present

## 2020-10-28 DIAGNOSIS — F419 Anxiety disorder, unspecified: Secondary | ICD-10-CM | POA: Diagnosis not present

## 2020-11-02 DIAGNOSIS — F410 Panic disorder [episodic paroxysmal anxiety] without agoraphobia: Secondary | ICD-10-CM | POA: Diagnosis not present

## 2020-11-02 DIAGNOSIS — F3175 Bipolar disorder, in partial remission, most recent episode depressed: Secondary | ICD-10-CM | POA: Diagnosis not present

## 2020-11-02 DIAGNOSIS — F840 Autistic disorder: Secondary | ICD-10-CM | POA: Diagnosis not present

## 2020-11-02 DIAGNOSIS — F419 Anxiety disorder, unspecified: Secondary | ICD-10-CM | POA: Diagnosis not present

## 2020-11-05 DIAGNOSIS — R413 Other amnesia: Secondary | ICD-10-CM | POA: Diagnosis not present

## 2020-11-05 DIAGNOSIS — F419 Anxiety disorder, unspecified: Secondary | ICD-10-CM | POA: Diagnosis not present

## 2020-11-05 DIAGNOSIS — F410 Panic disorder [episodic paroxysmal anxiety] without agoraphobia: Secondary | ICD-10-CM | POA: Diagnosis not present

## 2020-11-05 DIAGNOSIS — F3175 Bipolar disorder, in partial remission, most recent episode depressed: Secondary | ICD-10-CM | POA: Diagnosis not present

## 2020-11-09 DIAGNOSIS — F3175 Bipolar disorder, in partial remission, most recent episode depressed: Secondary | ICD-10-CM | POA: Diagnosis not present

## 2020-11-09 DIAGNOSIS — F840 Autistic disorder: Secondary | ICD-10-CM | POA: Diagnosis not present

## 2020-11-09 DIAGNOSIS — F419 Anxiety disorder, unspecified: Secondary | ICD-10-CM | POA: Diagnosis not present

## 2020-11-09 DIAGNOSIS — F410 Panic disorder [episodic paroxysmal anxiety] without agoraphobia: Secondary | ICD-10-CM | POA: Diagnosis not present

## 2020-11-11 DIAGNOSIS — E221 Hyperprolactinemia: Secondary | ICD-10-CM | POA: Diagnosis not present

## 2020-11-11 DIAGNOSIS — F3175 Bipolar disorder, in partial remission, most recent episode depressed: Secondary | ICD-10-CM | POA: Diagnosis not present

## 2020-11-11 DIAGNOSIS — F902 Attention-deficit hyperactivity disorder, combined type: Secondary | ICD-10-CM | POA: Diagnosis not present

## 2020-11-11 DIAGNOSIS — R413 Other amnesia: Secondary | ICD-10-CM | POA: Diagnosis not present

## 2020-11-11 DIAGNOSIS — F419 Anxiety disorder, unspecified: Secondary | ICD-10-CM | POA: Diagnosis not present

## 2020-11-11 DIAGNOSIS — F410 Panic disorder [episodic paroxysmal anxiety] without agoraphobia: Secondary | ICD-10-CM | POA: Diagnosis not present

## 2020-11-11 DIAGNOSIS — G4700 Insomnia, unspecified: Secondary | ICD-10-CM | POA: Diagnosis not present

## 2020-11-16 DIAGNOSIS — F840 Autistic disorder: Secondary | ICD-10-CM | POA: Diagnosis not present

## 2020-11-16 DIAGNOSIS — F410 Panic disorder [episodic paroxysmal anxiety] without agoraphobia: Secondary | ICD-10-CM | POA: Diagnosis not present

## 2020-11-16 DIAGNOSIS — F419 Anxiety disorder, unspecified: Secondary | ICD-10-CM | POA: Diagnosis not present

## 2020-11-16 DIAGNOSIS — F3175 Bipolar disorder, in partial remission, most recent episode depressed: Secondary | ICD-10-CM | POA: Diagnosis not present

## 2020-11-18 DIAGNOSIS — F840 Autistic disorder: Secondary | ICD-10-CM | POA: Diagnosis not present

## 2020-11-18 DIAGNOSIS — F3175 Bipolar disorder, in partial remission, most recent episode depressed: Secondary | ICD-10-CM | POA: Diagnosis not present

## 2020-11-18 DIAGNOSIS — F410 Panic disorder [episodic paroxysmal anxiety] without agoraphobia: Secondary | ICD-10-CM | POA: Diagnosis not present

## 2020-11-18 DIAGNOSIS — F419 Anxiety disorder, unspecified: Secondary | ICD-10-CM | POA: Diagnosis not present

## 2020-11-19 DIAGNOSIS — F410 Panic disorder [episodic paroxysmal anxiety] without agoraphobia: Secondary | ICD-10-CM | POA: Diagnosis not present

## 2020-11-19 DIAGNOSIS — F419 Anxiety disorder, unspecified: Secondary | ICD-10-CM | POA: Diagnosis not present

## 2020-11-19 DIAGNOSIS — R413 Other amnesia: Secondary | ICD-10-CM | POA: Diagnosis not present

## 2020-11-19 DIAGNOSIS — F3175 Bipolar disorder, in partial remission, most recent episode depressed: Secondary | ICD-10-CM | POA: Diagnosis not present

## 2020-11-25 DIAGNOSIS — F3175 Bipolar disorder, in partial remission, most recent episode depressed: Secondary | ICD-10-CM | POA: Diagnosis not present

## 2020-11-25 DIAGNOSIS — F410 Panic disorder [episodic paroxysmal anxiety] without agoraphobia: Secondary | ICD-10-CM | POA: Diagnosis not present

## 2020-11-25 DIAGNOSIS — F419 Anxiety disorder, unspecified: Secondary | ICD-10-CM | POA: Diagnosis not present

## 2020-11-25 DIAGNOSIS — F840 Autistic disorder: Secondary | ICD-10-CM | POA: Diagnosis not present

## 2020-11-27 DIAGNOSIS — F3175 Bipolar disorder, in partial remission, most recent episode depressed: Secondary | ICD-10-CM | POA: Diagnosis not present

## 2020-11-27 DIAGNOSIS — R413 Other amnesia: Secondary | ICD-10-CM | POA: Diagnosis not present

## 2020-11-27 DIAGNOSIS — F410 Panic disorder [episodic paroxysmal anxiety] without agoraphobia: Secondary | ICD-10-CM | POA: Diagnosis not present

## 2020-11-27 DIAGNOSIS — F902 Attention-deficit hyperactivity disorder, combined type: Secondary | ICD-10-CM | POA: Diagnosis not present

## 2020-12-02 DIAGNOSIS — F3175 Bipolar disorder, in partial remission, most recent episode depressed: Secondary | ICD-10-CM | POA: Diagnosis not present

## 2020-12-02 DIAGNOSIS — F410 Panic disorder [episodic paroxysmal anxiety] without agoraphobia: Secondary | ICD-10-CM | POA: Diagnosis not present

## 2020-12-02 DIAGNOSIS — F840 Autistic disorder: Secondary | ICD-10-CM | POA: Diagnosis not present

## 2020-12-02 DIAGNOSIS — F419 Anxiety disorder, unspecified: Secondary | ICD-10-CM | POA: Diagnosis not present

## 2020-12-09 DIAGNOSIS — F410 Panic disorder [episodic paroxysmal anxiety] without agoraphobia: Secondary | ICD-10-CM | POA: Diagnosis not present

## 2020-12-09 DIAGNOSIS — F3175 Bipolar disorder, in partial remission, most recent episode depressed: Secondary | ICD-10-CM | POA: Diagnosis not present

## 2020-12-09 DIAGNOSIS — F840 Autistic disorder: Secondary | ICD-10-CM | POA: Diagnosis not present

## 2020-12-09 DIAGNOSIS — F419 Anxiety disorder, unspecified: Secondary | ICD-10-CM | POA: Diagnosis not present

## 2020-12-10 DIAGNOSIS — F410 Panic disorder [episodic paroxysmal anxiety] without agoraphobia: Secondary | ICD-10-CM | POA: Diagnosis not present

## 2020-12-10 DIAGNOSIS — R413 Other amnesia: Secondary | ICD-10-CM | POA: Diagnosis not present

## 2020-12-10 DIAGNOSIS — F3175 Bipolar disorder, in partial remission, most recent episode depressed: Secondary | ICD-10-CM | POA: Diagnosis not present

## 2020-12-10 DIAGNOSIS — F419 Anxiety disorder, unspecified: Secondary | ICD-10-CM | POA: Diagnosis not present

## 2020-12-16 DIAGNOSIS — F3175 Bipolar disorder, in partial remission, most recent episode depressed: Secondary | ICD-10-CM | POA: Diagnosis not present

## 2020-12-16 DIAGNOSIS — F840 Autistic disorder: Secondary | ICD-10-CM | POA: Diagnosis not present

## 2020-12-16 DIAGNOSIS — F410 Panic disorder [episodic paroxysmal anxiety] without agoraphobia: Secondary | ICD-10-CM | POA: Diagnosis not present

## 2020-12-16 DIAGNOSIS — F419 Anxiety disorder, unspecified: Secondary | ICD-10-CM | POA: Diagnosis not present

## 2020-12-22 DIAGNOSIS — F419 Anxiety disorder, unspecified: Secondary | ICD-10-CM | POA: Diagnosis not present

## 2020-12-22 DIAGNOSIS — F410 Panic disorder [episodic paroxysmal anxiety] without agoraphobia: Secondary | ICD-10-CM | POA: Diagnosis not present

## 2020-12-22 DIAGNOSIS — F840 Autistic disorder: Secondary | ICD-10-CM | POA: Diagnosis not present

## 2020-12-22 DIAGNOSIS — F3175 Bipolar disorder, in partial remission, most recent episode depressed: Secondary | ICD-10-CM | POA: Diagnosis not present

## 2020-12-24 DIAGNOSIS — F410 Panic disorder [episodic paroxysmal anxiety] without agoraphobia: Secondary | ICD-10-CM | POA: Diagnosis not present

## 2020-12-24 DIAGNOSIS — F3175 Bipolar disorder, in partial remission, most recent episode depressed: Secondary | ICD-10-CM | POA: Diagnosis not present

## 2020-12-24 DIAGNOSIS — R413 Other amnesia: Secondary | ICD-10-CM | POA: Diagnosis not present

## 2020-12-24 DIAGNOSIS — F902 Attention-deficit hyperactivity disorder, combined type: Secondary | ICD-10-CM | POA: Diagnosis not present

## 2020-12-29 DIAGNOSIS — F3175 Bipolar disorder, in partial remission, most recent episode depressed: Secondary | ICD-10-CM | POA: Diagnosis not present

## 2020-12-29 DIAGNOSIS — F419 Anxiety disorder, unspecified: Secondary | ICD-10-CM | POA: Diagnosis not present

## 2020-12-29 DIAGNOSIS — F840 Autistic disorder: Secondary | ICD-10-CM | POA: Diagnosis not present

## 2020-12-29 DIAGNOSIS — F410 Panic disorder [episodic paroxysmal anxiety] without agoraphobia: Secondary | ICD-10-CM | POA: Diagnosis not present

## 2021-01-01 DIAGNOSIS — R197 Diarrhea, unspecified: Secondary | ICD-10-CM | POA: Diagnosis not present

## 2021-01-01 DIAGNOSIS — I10 Essential (primary) hypertension: Secondary | ICD-10-CM | POA: Diagnosis not present

## 2021-01-01 DIAGNOSIS — R7303 Prediabetes: Secondary | ICD-10-CM | POA: Diagnosis not present

## 2021-01-01 DIAGNOSIS — R1110 Vomiting, unspecified: Secondary | ICD-10-CM | POA: Diagnosis not present

## 2021-01-05 DIAGNOSIS — F840 Autistic disorder: Secondary | ICD-10-CM | POA: Diagnosis not present

## 2021-01-05 DIAGNOSIS — F410 Panic disorder [episodic paroxysmal anxiety] without agoraphobia: Secondary | ICD-10-CM | POA: Diagnosis not present

## 2021-01-05 DIAGNOSIS — F3175 Bipolar disorder, in partial remission, most recent episode depressed: Secondary | ICD-10-CM | POA: Diagnosis not present

## 2021-01-05 DIAGNOSIS — F419 Anxiety disorder, unspecified: Secondary | ICD-10-CM | POA: Diagnosis not present

## 2021-01-06 DIAGNOSIS — F410 Panic disorder [episodic paroxysmal anxiety] without agoraphobia: Secondary | ICD-10-CM | POA: Diagnosis not present

## 2021-01-06 DIAGNOSIS — F902 Attention-deficit hyperactivity disorder, combined type: Secondary | ICD-10-CM | POA: Diagnosis not present

## 2021-01-06 DIAGNOSIS — F419 Anxiety disorder, unspecified: Secondary | ICD-10-CM | POA: Diagnosis not present

## 2021-01-06 DIAGNOSIS — F3175 Bipolar disorder, in partial remission, most recent episode depressed: Secondary | ICD-10-CM | POA: Diagnosis not present

## 2021-01-12 DIAGNOSIS — F419 Anxiety disorder, unspecified: Secondary | ICD-10-CM | POA: Diagnosis not present

## 2021-01-12 DIAGNOSIS — F3175 Bipolar disorder, in partial remission, most recent episode depressed: Secondary | ICD-10-CM | POA: Diagnosis not present

## 2021-01-12 DIAGNOSIS — F902 Attention-deficit hyperactivity disorder, combined type: Secondary | ICD-10-CM | POA: Diagnosis not present

## 2021-01-12 DIAGNOSIS — F410 Panic disorder [episodic paroxysmal anxiety] without agoraphobia: Secondary | ICD-10-CM | POA: Diagnosis not present

## 2021-01-12 DIAGNOSIS — F840 Autistic disorder: Secondary | ICD-10-CM | POA: Diagnosis not present

## 2021-01-14 DIAGNOSIS — D2262 Melanocytic nevi of left upper limb, including shoulder: Secondary | ICD-10-CM | POA: Diagnosis not present

## 2021-01-22 DIAGNOSIS — F3175 Bipolar disorder, in partial remission, most recent episode depressed: Secondary | ICD-10-CM | POA: Diagnosis not present

## 2021-01-22 DIAGNOSIS — F410 Panic disorder [episodic paroxysmal anxiety] without agoraphobia: Secondary | ICD-10-CM | POA: Diagnosis not present

## 2021-01-22 DIAGNOSIS — F419 Anxiety disorder, unspecified: Secondary | ICD-10-CM | POA: Diagnosis not present

## 2021-01-22 DIAGNOSIS — E221 Hyperprolactinemia: Secondary | ICD-10-CM | POA: Diagnosis not present

## 2021-01-27 DIAGNOSIS — F419 Anxiety disorder, unspecified: Secondary | ICD-10-CM | POA: Diagnosis not present

## 2021-01-27 DIAGNOSIS — F410 Panic disorder [episodic paroxysmal anxiety] without agoraphobia: Secondary | ICD-10-CM | POA: Diagnosis not present

## 2021-01-27 DIAGNOSIS — E221 Hyperprolactinemia: Secondary | ICD-10-CM | POA: Diagnosis not present

## 2021-01-27 DIAGNOSIS — F3175 Bipolar disorder, in partial remission, most recent episode depressed: Secondary | ICD-10-CM | POA: Diagnosis not present

## 2021-02-02 DIAGNOSIS — F3175 Bipolar disorder, in partial remission, most recent episode depressed: Secondary | ICD-10-CM | POA: Diagnosis not present

## 2021-02-02 DIAGNOSIS — F840 Autistic disorder: Secondary | ICD-10-CM | POA: Diagnosis not present

## 2021-02-02 DIAGNOSIS — F419 Anxiety disorder, unspecified: Secondary | ICD-10-CM | POA: Diagnosis not present

## 2021-02-02 DIAGNOSIS — F410 Panic disorder [episodic paroxysmal anxiety] without agoraphobia: Secondary | ICD-10-CM | POA: Diagnosis not present

## 2021-02-03 DIAGNOSIS — Z01419 Encounter for gynecological examination (general) (routine) without abnormal findings: Secondary | ICD-10-CM | POA: Diagnosis not present

## 2021-02-03 DIAGNOSIS — Z6825 Body mass index (BMI) 25.0-25.9, adult: Secondary | ICD-10-CM | POA: Diagnosis not present

## 2021-02-04 DIAGNOSIS — F3175 Bipolar disorder, in partial remission, most recent episode depressed: Secondary | ICD-10-CM | POA: Diagnosis not present

## 2021-02-04 DIAGNOSIS — R413 Other amnesia: Secondary | ICD-10-CM | POA: Diagnosis not present

## 2021-02-04 DIAGNOSIS — F410 Panic disorder [episodic paroxysmal anxiety] without agoraphobia: Secondary | ICD-10-CM | POA: Diagnosis not present

## 2021-02-04 DIAGNOSIS — F902 Attention-deficit hyperactivity disorder, combined type: Secondary | ICD-10-CM | POA: Diagnosis not present

## 2021-02-23 DIAGNOSIS — F410 Panic disorder [episodic paroxysmal anxiety] without agoraphobia: Secondary | ICD-10-CM | POA: Diagnosis not present

## 2021-02-23 DIAGNOSIS — F840 Autistic disorder: Secondary | ICD-10-CM | POA: Diagnosis not present

## 2021-02-23 DIAGNOSIS — F3175 Bipolar disorder, in partial remission, most recent episode depressed: Secondary | ICD-10-CM | POA: Diagnosis not present

## 2021-02-23 DIAGNOSIS — F419 Anxiety disorder, unspecified: Secondary | ICD-10-CM | POA: Diagnosis not present

## 2021-02-25 DIAGNOSIS — F410 Panic disorder [episodic paroxysmal anxiety] without agoraphobia: Secondary | ICD-10-CM | POA: Diagnosis not present

## 2021-02-25 DIAGNOSIS — F419 Anxiety disorder, unspecified: Secondary | ICD-10-CM | POA: Diagnosis not present

## 2021-02-25 DIAGNOSIS — R413 Other amnesia: Secondary | ICD-10-CM | POA: Diagnosis not present

## 2021-02-25 DIAGNOSIS — F3175 Bipolar disorder, in partial remission, most recent episode depressed: Secondary | ICD-10-CM | POA: Diagnosis not present

## 2021-03-02 DIAGNOSIS — F840 Autistic disorder: Secondary | ICD-10-CM | POA: Diagnosis not present

## 2021-03-02 DIAGNOSIS — F410 Panic disorder [episodic paroxysmal anxiety] without agoraphobia: Secondary | ICD-10-CM | POA: Diagnosis not present

## 2021-03-02 DIAGNOSIS — F3175 Bipolar disorder, in partial remission, most recent episode depressed: Secondary | ICD-10-CM | POA: Diagnosis not present

## 2021-03-02 DIAGNOSIS — F419 Anxiety disorder, unspecified: Secondary | ICD-10-CM | POA: Diagnosis not present

## 2021-03-09 DIAGNOSIS — F3175 Bipolar disorder, in partial remission, most recent episode depressed: Secondary | ICD-10-CM | POA: Diagnosis not present

## 2021-03-09 DIAGNOSIS — F419 Anxiety disorder, unspecified: Secondary | ICD-10-CM | POA: Diagnosis not present

## 2021-03-09 DIAGNOSIS — F410 Panic disorder [episodic paroxysmal anxiety] without agoraphobia: Secondary | ICD-10-CM | POA: Diagnosis not present

## 2021-03-09 DIAGNOSIS — F840 Autistic disorder: Secondary | ICD-10-CM | POA: Diagnosis not present

## 2021-03-11 DIAGNOSIS — F39 Unspecified mood [affective] disorder: Secondary | ICD-10-CM | POA: Diagnosis not present

## 2021-03-11 DIAGNOSIS — R000 Tachycardia, unspecified: Secondary | ICD-10-CM | POA: Diagnosis not present

## 2021-03-11 DIAGNOSIS — I10 Essential (primary) hypertension: Secondary | ICD-10-CM | POA: Diagnosis not present

## 2021-03-11 DIAGNOSIS — R7302 Impaired glucose tolerance (oral): Secondary | ICD-10-CM | POA: Diagnosis not present

## 2021-03-12 DIAGNOSIS — F419 Anxiety disorder, unspecified: Secondary | ICD-10-CM | POA: Diagnosis not present

## 2021-03-12 DIAGNOSIS — F410 Panic disorder [episodic paroxysmal anxiety] without agoraphobia: Secondary | ICD-10-CM | POA: Diagnosis not present

## 2021-03-12 DIAGNOSIS — F3175 Bipolar disorder, in partial remission, most recent episode depressed: Secondary | ICD-10-CM | POA: Diagnosis not present

## 2021-03-12 DIAGNOSIS — E221 Hyperprolactinemia: Secondary | ICD-10-CM | POA: Diagnosis not present

## 2021-03-16 DIAGNOSIS — F3175 Bipolar disorder, in partial remission, most recent episode depressed: Secondary | ICD-10-CM | POA: Diagnosis not present

## 2021-03-16 DIAGNOSIS — F419 Anxiety disorder, unspecified: Secondary | ICD-10-CM | POA: Diagnosis not present

## 2021-03-16 DIAGNOSIS — F840 Autistic disorder: Secondary | ICD-10-CM | POA: Diagnosis not present

## 2021-03-16 DIAGNOSIS — F410 Panic disorder [episodic paroxysmal anxiety] without agoraphobia: Secondary | ICD-10-CM | POA: Diagnosis not present

## 2021-03-23 DIAGNOSIS — F419 Anxiety disorder, unspecified: Secondary | ICD-10-CM | POA: Diagnosis not present

## 2021-03-23 DIAGNOSIS — F840 Autistic disorder: Secondary | ICD-10-CM | POA: Diagnosis not present

## 2021-03-23 DIAGNOSIS — F3175 Bipolar disorder, in partial remission, most recent episode depressed: Secondary | ICD-10-CM | POA: Diagnosis not present

## 2021-03-23 DIAGNOSIS — F410 Panic disorder [episodic paroxysmal anxiety] without agoraphobia: Secondary | ICD-10-CM | POA: Diagnosis not present

## 2021-04-02 DIAGNOSIS — F410 Panic disorder [episodic paroxysmal anxiety] without agoraphobia: Secondary | ICD-10-CM | POA: Diagnosis not present

## 2021-04-02 DIAGNOSIS — F840 Autistic disorder: Secondary | ICD-10-CM | POA: Diagnosis not present

## 2021-04-02 DIAGNOSIS — F3175 Bipolar disorder, in partial remission, most recent episode depressed: Secondary | ICD-10-CM | POA: Diagnosis not present

## 2021-04-02 DIAGNOSIS — F419 Anxiety disorder, unspecified: Secondary | ICD-10-CM | POA: Diagnosis not present

## 2021-04-06 DIAGNOSIS — F419 Anxiety disorder, unspecified: Secondary | ICD-10-CM | POA: Diagnosis not present

## 2021-04-06 DIAGNOSIS — F410 Panic disorder [episodic paroxysmal anxiety] without agoraphobia: Secondary | ICD-10-CM | POA: Diagnosis not present

## 2021-04-06 DIAGNOSIS — F840 Autistic disorder: Secondary | ICD-10-CM | POA: Diagnosis not present

## 2021-04-06 DIAGNOSIS — F3175 Bipolar disorder, in partial remission, most recent episode depressed: Secondary | ICD-10-CM | POA: Diagnosis not present

## 2021-04-09 DIAGNOSIS — F410 Panic disorder [episodic paroxysmal anxiety] without agoraphobia: Secondary | ICD-10-CM | POA: Diagnosis not present

## 2021-04-09 DIAGNOSIS — F3175 Bipolar disorder, in partial remission, most recent episode depressed: Secondary | ICD-10-CM | POA: Diagnosis not present

## 2021-04-09 DIAGNOSIS — F840 Autistic disorder: Secondary | ICD-10-CM | POA: Diagnosis not present

## 2021-04-09 DIAGNOSIS — F419 Anxiety disorder, unspecified: Secondary | ICD-10-CM | POA: Diagnosis not present

## 2021-04-13 DIAGNOSIS — F3175 Bipolar disorder, in partial remission, most recent episode depressed: Secondary | ICD-10-CM | POA: Diagnosis not present

## 2021-04-13 DIAGNOSIS — F840 Autistic disorder: Secondary | ICD-10-CM | POA: Diagnosis not present

## 2021-04-13 DIAGNOSIS — F410 Panic disorder [episodic paroxysmal anxiety] without agoraphobia: Secondary | ICD-10-CM | POA: Diagnosis not present

## 2021-04-13 DIAGNOSIS — F419 Anxiety disorder, unspecified: Secondary | ICD-10-CM | POA: Diagnosis not present

## 2021-04-20 DIAGNOSIS — F3175 Bipolar disorder, in partial remission, most recent episode depressed: Secondary | ICD-10-CM | POA: Diagnosis not present

## 2021-04-20 DIAGNOSIS — F419 Anxiety disorder, unspecified: Secondary | ICD-10-CM | POA: Diagnosis not present

## 2021-04-20 DIAGNOSIS — F840 Autistic disorder: Secondary | ICD-10-CM | POA: Diagnosis not present

## 2021-04-20 DIAGNOSIS — F410 Panic disorder [episodic paroxysmal anxiety] without agoraphobia: Secondary | ICD-10-CM | POA: Diagnosis not present

## 2021-04-22 DIAGNOSIS — R7303 Prediabetes: Secondary | ICD-10-CM | POA: Diagnosis not present

## 2021-04-27 DIAGNOSIS — F419 Anxiety disorder, unspecified: Secondary | ICD-10-CM | POA: Diagnosis not present

## 2021-04-27 DIAGNOSIS — F410 Panic disorder [episodic paroxysmal anxiety] without agoraphobia: Secondary | ICD-10-CM | POA: Diagnosis not present

## 2021-04-27 DIAGNOSIS — F840 Autistic disorder: Secondary | ICD-10-CM | POA: Diagnosis not present

## 2021-04-27 DIAGNOSIS — F3175 Bipolar disorder, in partial remission, most recent episode depressed: Secondary | ICD-10-CM | POA: Diagnosis not present

## 2021-05-04 DIAGNOSIS — F419 Anxiety disorder, unspecified: Secondary | ICD-10-CM | POA: Diagnosis not present

## 2021-05-04 DIAGNOSIS — F840 Autistic disorder: Secondary | ICD-10-CM | POA: Diagnosis not present

## 2021-05-04 DIAGNOSIS — F3175 Bipolar disorder, in partial remission, most recent episode depressed: Secondary | ICD-10-CM | POA: Diagnosis not present

## 2021-05-04 DIAGNOSIS — F410 Panic disorder [episodic paroxysmal anxiety] without agoraphobia: Secondary | ICD-10-CM | POA: Diagnosis not present

## 2021-05-06 DIAGNOSIS — L905 Scar conditions and fibrosis of skin: Secondary | ICD-10-CM | POA: Diagnosis not present

## 2021-05-11 DIAGNOSIS — F902 Attention-deficit hyperactivity disorder, combined type: Secondary | ICD-10-CM | POA: Diagnosis not present

## 2021-05-11 DIAGNOSIS — F419 Anxiety disorder, unspecified: Secondary | ICD-10-CM | POA: Diagnosis not present

## 2021-05-11 DIAGNOSIS — F3175 Bipolar disorder, in partial remission, most recent episode depressed: Secondary | ICD-10-CM | POA: Diagnosis not present

## 2021-05-11 DIAGNOSIS — F410 Panic disorder [episodic paroxysmal anxiety] without agoraphobia: Secondary | ICD-10-CM | POA: Diagnosis not present

## 2021-05-18 DIAGNOSIS — F3175 Bipolar disorder, in partial remission, most recent episode depressed: Secondary | ICD-10-CM | POA: Diagnosis not present

## 2021-05-18 DIAGNOSIS — F840 Autistic disorder: Secondary | ICD-10-CM | POA: Diagnosis not present

## 2021-05-18 DIAGNOSIS — F902 Attention-deficit hyperactivity disorder, combined type: Secondary | ICD-10-CM | POA: Diagnosis not present

## 2021-05-18 DIAGNOSIS — F410 Panic disorder [episodic paroxysmal anxiety] without agoraphobia: Secondary | ICD-10-CM | POA: Diagnosis not present

## 2021-05-18 DIAGNOSIS — F419 Anxiety disorder, unspecified: Secondary | ICD-10-CM | POA: Diagnosis not present

## 2021-06-01 DIAGNOSIS — F3175 Bipolar disorder, in partial remission, most recent episode depressed: Secondary | ICD-10-CM | POA: Diagnosis not present

## 2021-06-01 DIAGNOSIS — F419 Anxiety disorder, unspecified: Secondary | ICD-10-CM | POA: Diagnosis not present

## 2021-06-01 DIAGNOSIS — F840 Autistic disorder: Secondary | ICD-10-CM | POA: Diagnosis not present

## 2021-06-01 DIAGNOSIS — F410 Panic disorder [episodic paroxysmal anxiety] without agoraphobia: Secondary | ICD-10-CM | POA: Diagnosis not present

## 2021-06-02 NOTE — Progress Notes (Deleted)
CARDIOLOGY CONSULT NOTE       Patient ID: Alyssa Sanford MRN: [id number] DOB/AGE: Dec 19, 1995 25 y.o.  Admit date: (Not on file) Referring Physician: Hyman Hopes Primary Physician: Patient, No Pcp Per (Inactive) Primary Cardiologist: New Reason for Consultation: Tachycardia  Active Problems:   * No active hospital problems. *   HPI:  25 y.o. referred by Dr Hyman Hopes for tachycardia Sees her for HTN. Has had pheo/RAS r/o in past She is bipolar and struggles with this September 20222 felt rapid HR with exercise Watch indicated rate 172 She is on beta blocker for HTN. She has not had syncope or history of arrhythmia She is on Adderall XR in addition to her bipolar drugs   Lab review with CR 1.13 K 4.2 Hct 14.2     ROS All other systems reviewed and negative except as noted above  Past Medical History:  Diagnosis Date   Hypertension    Strep throat     Family History  Problem Relation Age of Onset   Breast cancer Maternal Grandmother     Social History   Socioeconomic History   Marital status: Single    Spouse name: Not on file   Number of children: Not on file   Years of education: Not on file   Highest education level: Not on file  Occupational History   Not on file  Tobacco Use   Smoking status: Never   Smokeless tobacco: Not on file  Substance and Sexual Activity   Alcohol use: Yes   Drug use: No   Sexual activity: Yes    Birth control/protection: Pill  Other Topics Concern   Not on file  Social History Narrative   Not on file   Social Determinants of Health   Financial Resource Strain: Not on file  Food Insecurity: Not on file  Transportation Needs: Not on file  Physical Activity: Not on file  Stress: Not on file  Social Connections: Not on file  Intimate Partner Violence: Not on file    *** The histories are not reviewed yet. Please review them in the "History" navigator section and refresh this SmartLink.    Current Outpatient Medications:     amoxicillin (AMOXIL) 875 MG tablet, Take 875 mg by mouth 2 (two) times daily., Disp: , Rfl:    BIOTIN PO, Take 2 tablets by mouth daily., Disp: , Rfl:    diphenhydrAMINE (BENADRYL) 25 MG tablet, Take 1 tablet (25 mg total) by mouth every 8 (eight) hours as needed., Disp: 11 tablet, Rfl: 0   diphenhydrAMINE (SOMINEX) 25 MG tablet, Take 50 mg by mouth at bedtime as needed for itching, allergies or sleep., Disp: , Rfl:    norethindrone-ethinyl estradiol-iron (MICROGESTIN FE,GILDESS FE,LOESTRIN FE) 1.5-30 MG-MCG tablet, Take 1 tablet by mouth daily., Disp: , Rfl:    PARoxetine (PAXIL) 40 MG tablet, Take 40 mg by mouth every morning., Disp: , Rfl:    Phenylephrine-DM-GG (TUSSIN CF PO), Take 1 tablet by mouth daily., Disp: , Rfl:    pseudoephedrine (SUDAFED) 120 MG 12 hr tablet, Take 120 mg by mouth 2 (two) times daily., Disp: , Rfl:     Physical Exam: There were no vitals taken for this visit.    Affect appropriate Healthy:  appears stated age HEENT: normal Neck supple with no adenopathy JVP normal no bruits no thyromegaly Lungs clear with no wheezing and good diaphragmatic motion Heart:  S1/S2 no murmur, no rub, gallop or click PMI normal Abdomen: benighn, BS positve, no tenderness, no  AAA no bruit.  No HSM or HJR Distal pulses intact with no bruits No edema Neuro non-focal Skin warm and dry No muscular weakness   Labs:   Lab Results  Component Value Date   WBC 6.6 07/26/2014   HGB 14.0 07/26/2014   HCT 42.4 07/26/2014   MCV 88.5 07/26/2014   PLT 177 07/26/2014   No results for input(s): NA, K, CL, CO2, BUN, CREATININE, CALCIUM, PROT, BILITOT, ALKPHOS, ALT, AST, GLUCOSE in the last 168 hours.  Invalid input(s): LABALBU No results found for: CKTOTAL, CKMB, CKMBINDEX, TROPONINI No results found for: CHOL No results found for: HDL No results found for: LDLCALC No results found for: TRIG No results found for: CHOLHDL No results found for: LDLDIRECT    Radiology: No results  found.  EKG: ***   ASSESSMENT AND PLAN:   Tachycardia:  TSH/Hct normal on Adderall which is not helpful Seems like isolated episode with exertion. *** HTN:  Well controlled.  Continue current medications and low sodium Dash type diet.    Bipolar :  f/u with psychiatry continue  Quetiapine and Risperidone  Anxiety:  has clonazepam as needed ADHD:  on Adderall consider changing to non stimulating med like Strattera    Echo ***  *** Signed: Charlton Haws 06/02/2021, 7:56 PM

## 2021-06-08 ENCOUNTER — Ambulatory Visit: Payer: BC Managed Care – PPO | Admitting: Cardiovascular Disease

## 2021-06-08 DIAGNOSIS — F419 Anxiety disorder, unspecified: Secondary | ICD-10-CM | POA: Diagnosis not present

## 2021-06-08 DIAGNOSIS — F410 Panic disorder [episodic paroxysmal anxiety] without agoraphobia: Secondary | ICD-10-CM | POA: Diagnosis not present

## 2021-06-08 DIAGNOSIS — F3175 Bipolar disorder, in partial remission, most recent episode depressed: Secondary | ICD-10-CM | POA: Diagnosis not present

## 2021-06-08 DIAGNOSIS — F840 Autistic disorder: Secondary | ICD-10-CM | POA: Diagnosis not present

## 2021-06-11 DIAGNOSIS — G4700 Insomnia, unspecified: Secondary | ICD-10-CM | POA: Diagnosis not present

## 2021-06-11 DIAGNOSIS — E221 Hyperprolactinemia: Secondary | ICD-10-CM | POA: Diagnosis not present

## 2021-06-11 DIAGNOSIS — F39 Unspecified mood [affective] disorder: Secondary | ICD-10-CM | POA: Diagnosis not present

## 2021-06-11 DIAGNOSIS — I10 Essential (primary) hypertension: Secondary | ICD-10-CM | POA: Diagnosis not present

## 2021-06-11 DIAGNOSIS — F909 Attention-deficit hyperactivity disorder, unspecified type: Secondary | ICD-10-CM | POA: Diagnosis not present

## 2021-06-11 DIAGNOSIS — R7302 Impaired glucose tolerance (oral): Secondary | ICD-10-CM | POA: Diagnosis not present

## 2021-06-11 DIAGNOSIS — F419 Anxiety disorder, unspecified: Secondary | ICD-10-CM | POA: Diagnosis not present

## 2021-06-11 DIAGNOSIS — F410 Panic disorder [episodic paroxysmal anxiety] without agoraphobia: Secondary | ICD-10-CM | POA: Diagnosis not present

## 2021-06-15 DIAGNOSIS — F410 Panic disorder [episodic paroxysmal anxiety] without agoraphobia: Secondary | ICD-10-CM | POA: Diagnosis not present

## 2021-06-15 DIAGNOSIS — F419 Anxiety disorder, unspecified: Secondary | ICD-10-CM | POA: Diagnosis not present

## 2021-06-15 DIAGNOSIS — F3175 Bipolar disorder, in partial remission, most recent episode depressed: Secondary | ICD-10-CM | POA: Diagnosis not present

## 2021-06-15 DIAGNOSIS — F840 Autistic disorder: Secondary | ICD-10-CM | POA: Diagnosis not present

## 2021-07-06 DIAGNOSIS — F840 Autistic disorder: Secondary | ICD-10-CM | POA: Diagnosis not present

## 2021-07-06 DIAGNOSIS — F902 Attention-deficit hyperactivity disorder, combined type: Secondary | ICD-10-CM | POA: Diagnosis not present

## 2021-07-06 DIAGNOSIS — F3175 Bipolar disorder, in partial remission, most recent episode depressed: Secondary | ICD-10-CM | POA: Diagnosis not present

## 2021-07-06 DIAGNOSIS — F419 Anxiety disorder, unspecified: Secondary | ICD-10-CM | POA: Diagnosis not present

## 2021-07-09 DIAGNOSIS — F410 Panic disorder [episodic paroxysmal anxiety] without agoraphobia: Secondary | ICD-10-CM | POA: Diagnosis not present

## 2021-07-09 DIAGNOSIS — E221 Hyperprolactinemia: Secondary | ICD-10-CM | POA: Diagnosis not present

## 2021-07-09 DIAGNOSIS — F419 Anxiety disorder, unspecified: Secondary | ICD-10-CM | POA: Diagnosis not present

## 2021-07-09 DIAGNOSIS — G4700 Insomnia, unspecified: Secondary | ICD-10-CM | POA: Diagnosis not present

## 2021-07-13 DIAGNOSIS — F3175 Bipolar disorder, in partial remission, most recent episode depressed: Secondary | ICD-10-CM | POA: Diagnosis not present

## 2021-07-13 DIAGNOSIS — F419 Anxiety disorder, unspecified: Secondary | ICD-10-CM | POA: Diagnosis not present

## 2021-07-13 DIAGNOSIS — F840 Autistic disorder: Secondary | ICD-10-CM | POA: Diagnosis not present

## 2021-07-13 DIAGNOSIS — F902 Attention-deficit hyperactivity disorder, combined type: Secondary | ICD-10-CM | POA: Diagnosis not present

## 2021-07-13 NOTE — Progress Notes (Incomplete)
CARDIOLOGY CONSULT NOTE       Patient ID: Alyssa Sanford MRN: [id number] DOB/AGE: [birth year] 26 y.o.  Admit date: (Not on file) Referring Physician: webb Primary Physician: Patient, No Pcp Per (Inactive) Primary Cardiologist: New Reason for Consultation: Tachyarrhythmia  Active Problems:   * No active hospital problems. *   HPI:  26 y.o. referred by Dr Hyman Hopes for tachyarrhythmia. History of ADHD, Bipolar, Anxiety/Depression HTN and glucose intolerance 03/11/21 complained to Dr Hyman Hopes of rapid HR to 178 with exercise. She is on Atenolol for her HTN She takes Adderall for her ADHD   ***  ROS All other systems reviewed and negative except as noted above  Past Medical History:  Diagnosis Date   ADHD    Bipolar 1 disorder (HCC)    Depression with anxiety    DM (diabetes mellitus) (HCC)    Hypertension    Impaired glucose tolerance    Strep throat    Tachyarrhythmia     Family History  Problem Relation Age of Onset   Healthy Mother    Healthy Father    Breast cancer Maternal Grandmother     Social History   Socioeconomic History   Marital status: Single    Spouse name: Not on file   Number of children: Not on file   Years of education: Not on file   Highest education level: Not on file  Occupational History   Not on file  Tobacco Use   Smoking status: Never   Smokeless tobacco: Not on file  Substance and Sexual Activity   Alcohol use: Yes   Drug use: No   Sexual activity: Yes    Birth control/protection: Pill  Other Topics Concern   Not on file  Social History Narrative   Not on file   Social Determinants of Health   Financial Resource Strain: Not on file  Food Insecurity: Not on file  Transportation Needs: Not on file  Physical Activity: Not on file  Stress: Not on file  Social Connections: Not on file  Intimate Partner Violence: Not on file    *** The histories are not reviewed yet. Please review them in the "History"  navigator section and refresh this SmartLink.    Current Outpatient Medications:    amLODipine (NORVASC) 10 MG tablet, Take 10 mg by mouth daily., Disp: , Rfl:    amoxicillin (AMOXIL) 875 MG tablet, Take 875 mg by mouth 2 (two) times daily., Disp: , Rfl:    amphetamine-dextroamphetamine (ADDERALL XR) 25 MG 24 hr capsule, Take 25 mg by mouth every morning., Disp: , Rfl:    atenolol (TENORMIN) 100 MG tablet, Take 100 mg by mouth daily., Disp: , Rfl:    BIOTIN PO, Take 2 tablets by mouth daily., Disp: , Rfl:    clonazePAM (KLONOPIN) 0.5 MG tablet, Take 0.5 mg by mouth 2 (two) times daily as needed for anxiety., Disp: , Rfl:    diphenhydrAMINE (BENADRYL) 25 MG tablet, Take 1 tablet (25 mg total) by mouth every 8 (eight) hours as needed., Disp: 11 tablet, Rfl: 0   diphenhydrAMINE (SOMINEX) 25 MG tablet, Take 50 mg by mouth at bedtime as needed for itching, allergies or sleep., Disp: , Rfl:    etonogestrel (NEXPLANON) 68 MG IMPL implant, 1 each by Subdermal route once., Disp: , Rfl:    LITHIUM PO, Take by mouth., Disp: , Rfl:    METFORMIN HCL PO, Take by mouth., Disp: , Rfl:    norethindrone-ethinyl estradiol-iron (MICROGESTIN FE,GILDESS FE,LOESTRIN FE) 1.5-30  MG-MCG tablet, Take 1 tablet by mouth daily., Disp: , Rfl:    PARoxetine (PAXIL) 40 MG tablet, Take 40 mg by mouth every morning., Disp: , Rfl:    Phenylephrine-DM-GG (TUSSIN CF PO), Take 1 tablet by mouth daily., Disp: , Rfl:    pseudoephedrine (SUDAFED) 120 MG 12 hr tablet, Take 120 mg by mouth 2 (two) times daily., Disp: , Rfl:    QUEtiapine (SEROQUEL) 25 MG tablet, Take 25 mg by mouth at bedtime., Disp: , Rfl:    risperiDONE (RISPERDAL) 1 MG tablet, Take 1 mg by mouth at bedtime., Disp: , Rfl:    risperidone (RISPERDAL) 4 MG tablet, Take 4 mg by mouth 2 (two) times daily., Disp: , Rfl:     Physical Exam: There were no vitals taken for this visit.    Affect appropriate Healthy:  appears stated age HEENT:  normal Neck supple with no adenopathy JVP normal no bruits no thyromegaly Lungs clear with no wheezing and good diaphragmatic motion Heart:  S1/S2 no murmur, no rub, gallop or click PMI normal Abdomen: benighn, BS positve, no tenderness, no AAA no bruit.  No HSM or HJR Distal pulses intact with no bruits No edema Neuro non-focal Skin warm and dry No muscular weakness   Labs:   Lab Results  Component Value Date   WBC 6.6 07/26/2014   HGB 14.0 07/26/2014   HCT 42.4 07/26/2014   MCV 88.5 07/26/2014   PLT 177 07/26/2014   No results for input(s): NA, K, CL, CO2, BUN, CREATININE, CALCIUM, PROT, BILITOT, ALKPHOS, ALT, AST, GLUCOSE in the last 168 hours.  Invalid input(s): LABALBU No results found for: CKTOTAL, CKMB, CKMBINDEX, TROPONINI No results found for: CHOL No results found for: HDL No results found for: LDLCALC No results found for: TRIG No results found for: CHOLHDL No results found for: LDLDIRECT    Radiology: No results found.  EKG: ***   ASSESSMENT AND PLAN:   Palpitations:  continue Atenolol 30 day event monitor and echo  ADHD/Bipolar:  f/u psych Polypharmacy with Risperdal, Adderall , Lithium  and Seroquel consider non stimulant Rx like Strattera instead of Adderall  HTN:  f/u Dr Hyman Hopes on Atenolol and norvasc   30 day monitor TTE  F/U PRN   Signed: Charlton Haws 07/13/2021, 4:33 PM

## 2021-07-20 DIAGNOSIS — F902 Attention-deficit hyperactivity disorder, combined type: Secondary | ICD-10-CM | POA: Diagnosis not present

## 2021-07-20 DIAGNOSIS — F840 Autistic disorder: Secondary | ICD-10-CM | POA: Diagnosis not present

## 2021-07-20 DIAGNOSIS — F419 Anxiety disorder, unspecified: Secondary | ICD-10-CM | POA: Diagnosis not present

## 2021-07-20 DIAGNOSIS — F3175 Bipolar disorder, in partial remission, most recent episode depressed: Secondary | ICD-10-CM | POA: Diagnosis not present

## 2021-07-22 ENCOUNTER — Ambulatory Visit: Payer: BC Managed Care – PPO | Admitting: Cardiovascular Disease

## 2021-07-27 DIAGNOSIS — F3175 Bipolar disorder, in partial remission, most recent episode depressed: Secondary | ICD-10-CM | POA: Diagnosis not present

## 2021-07-27 DIAGNOSIS — F902 Attention-deficit hyperactivity disorder, combined type: Secondary | ICD-10-CM | POA: Diagnosis not present

## 2021-07-27 DIAGNOSIS — F840 Autistic disorder: Secondary | ICD-10-CM | POA: Diagnosis not present

## 2021-07-27 DIAGNOSIS — F419 Anxiety disorder, unspecified: Secondary | ICD-10-CM | POA: Diagnosis not present

## 2021-08-03 DIAGNOSIS — F3175 Bipolar disorder, in partial remission, most recent episode depressed: Secondary | ICD-10-CM | POA: Diagnosis not present

## 2021-08-03 DIAGNOSIS — F840 Autistic disorder: Secondary | ICD-10-CM | POA: Diagnosis not present

## 2021-08-03 DIAGNOSIS — F902 Attention-deficit hyperactivity disorder, combined type: Secondary | ICD-10-CM | POA: Diagnosis not present

## 2021-08-03 DIAGNOSIS — F419 Anxiety disorder, unspecified: Secondary | ICD-10-CM | POA: Diagnosis not present

## 2021-08-04 DIAGNOSIS — L02416 Cutaneous abscess of left lower limb: Secondary | ICD-10-CM | POA: Diagnosis not present

## 2021-08-05 DIAGNOSIS — G4700 Insomnia, unspecified: Secondary | ICD-10-CM | POA: Diagnosis not present

## 2021-08-05 DIAGNOSIS — F410 Panic disorder [episodic paroxysmal anxiety] without agoraphobia: Secondary | ICD-10-CM | POA: Diagnosis not present

## 2021-08-05 DIAGNOSIS — F419 Anxiety disorder, unspecified: Secondary | ICD-10-CM | POA: Diagnosis not present

## 2021-08-05 DIAGNOSIS — E221 Hyperprolactinemia: Secondary | ICD-10-CM | POA: Diagnosis not present

## 2021-08-10 DIAGNOSIS — F3175 Bipolar disorder, in partial remission, most recent episode depressed: Secondary | ICD-10-CM | POA: Diagnosis not present

## 2021-08-10 DIAGNOSIS — F902 Attention-deficit hyperactivity disorder, combined type: Secondary | ICD-10-CM | POA: Diagnosis not present

## 2021-08-10 DIAGNOSIS — F419 Anxiety disorder, unspecified: Secondary | ICD-10-CM | POA: Diagnosis not present

## 2021-08-10 DIAGNOSIS — F840 Autistic disorder: Secondary | ICD-10-CM | POA: Diagnosis not present

## 2021-08-17 DIAGNOSIS — F3175 Bipolar disorder, in partial remission, most recent episode depressed: Secondary | ICD-10-CM | POA: Diagnosis not present

## 2021-08-17 DIAGNOSIS — F902 Attention-deficit hyperactivity disorder, combined type: Secondary | ICD-10-CM | POA: Diagnosis not present

## 2021-08-17 DIAGNOSIS — F419 Anxiety disorder, unspecified: Secondary | ICD-10-CM | POA: Diagnosis not present

## 2021-08-17 DIAGNOSIS — F840 Autistic disorder: Secondary | ICD-10-CM | POA: Diagnosis not present

## 2021-08-31 DIAGNOSIS — F419 Anxiety disorder, unspecified: Secondary | ICD-10-CM | POA: Diagnosis not present

## 2021-08-31 DIAGNOSIS — F3175 Bipolar disorder, in partial remission, most recent episode depressed: Secondary | ICD-10-CM | POA: Diagnosis not present

## 2021-08-31 DIAGNOSIS — F840 Autistic disorder: Secondary | ICD-10-CM | POA: Diagnosis not present

## 2021-08-31 DIAGNOSIS — F902 Attention-deficit hyperactivity disorder, combined type: Secondary | ICD-10-CM | POA: Diagnosis not present

## 2021-09-02 DIAGNOSIS — G4700 Insomnia, unspecified: Secondary | ICD-10-CM | POA: Diagnosis not present

## 2021-09-02 DIAGNOSIS — F419 Anxiety disorder, unspecified: Secondary | ICD-10-CM | POA: Diagnosis not present

## 2021-09-02 DIAGNOSIS — F410 Panic disorder [episodic paroxysmal anxiety] without agoraphobia: Secondary | ICD-10-CM | POA: Diagnosis not present

## 2021-09-02 DIAGNOSIS — E221 Hyperprolactinemia: Secondary | ICD-10-CM | POA: Diagnosis not present

## 2021-09-07 DIAGNOSIS — F3175 Bipolar disorder, in partial remission, most recent episode depressed: Secondary | ICD-10-CM | POA: Diagnosis not present

## 2021-09-07 DIAGNOSIS — F419 Anxiety disorder, unspecified: Secondary | ICD-10-CM | POA: Diagnosis not present

## 2021-09-07 DIAGNOSIS — F902 Attention-deficit hyperactivity disorder, combined type: Secondary | ICD-10-CM | POA: Diagnosis not present

## 2021-09-07 DIAGNOSIS — F840 Autistic disorder: Secondary | ICD-10-CM | POA: Diagnosis not present

## 2021-09-14 DIAGNOSIS — F419 Anxiety disorder, unspecified: Secondary | ICD-10-CM | POA: Diagnosis not present

## 2021-09-14 DIAGNOSIS — F840 Autistic disorder: Secondary | ICD-10-CM | POA: Diagnosis not present

## 2021-09-14 DIAGNOSIS — F902 Attention-deficit hyperactivity disorder, combined type: Secondary | ICD-10-CM | POA: Diagnosis not present

## 2021-09-14 DIAGNOSIS — F3175 Bipolar disorder, in partial remission, most recent episode depressed: Secondary | ICD-10-CM | POA: Diagnosis not present

## 2021-09-20 DIAGNOSIS — F909 Attention-deficit hyperactivity disorder, unspecified type: Secondary | ICD-10-CM | POA: Diagnosis not present

## 2021-09-20 DIAGNOSIS — F39 Unspecified mood [affective] disorder: Secondary | ICD-10-CM | POA: Diagnosis not present

## 2021-09-20 DIAGNOSIS — F840 Autistic disorder: Secondary | ICD-10-CM | POA: Diagnosis not present

## 2021-09-20 DIAGNOSIS — F902 Attention-deficit hyperactivity disorder, combined type: Secondary | ICD-10-CM | POA: Diagnosis not present

## 2021-09-20 DIAGNOSIS — F3175 Bipolar disorder, in partial remission, most recent episode depressed: Secondary | ICD-10-CM | POA: Diagnosis not present

## 2021-09-20 DIAGNOSIS — I10 Essential (primary) hypertension: Secondary | ICD-10-CM | POA: Diagnosis not present

## 2021-09-20 DIAGNOSIS — F419 Anxiety disorder, unspecified: Secondary | ICD-10-CM | POA: Diagnosis not present

## 2021-09-20 DIAGNOSIS — R7302 Impaired glucose tolerance (oral): Secondary | ICD-10-CM | POA: Diagnosis not present

## 2021-09-21 DIAGNOSIS — F419 Anxiety disorder, unspecified: Secondary | ICD-10-CM | POA: Diagnosis not present

## 2021-09-21 DIAGNOSIS — F840 Autistic disorder: Secondary | ICD-10-CM | POA: Diagnosis not present

## 2021-09-21 DIAGNOSIS — F3175 Bipolar disorder, in partial remission, most recent episode depressed: Secondary | ICD-10-CM | POA: Diagnosis not present

## 2021-09-21 DIAGNOSIS — F902 Attention-deficit hyperactivity disorder, combined type: Secondary | ICD-10-CM | POA: Diagnosis not present

## 2021-09-28 DIAGNOSIS — F3175 Bipolar disorder, in partial remission, most recent episode depressed: Secondary | ICD-10-CM | POA: Diagnosis not present

## 2021-09-28 DIAGNOSIS — F840 Autistic disorder: Secondary | ICD-10-CM | POA: Diagnosis not present

## 2021-09-28 DIAGNOSIS — F902 Attention-deficit hyperactivity disorder, combined type: Secondary | ICD-10-CM | POA: Diagnosis not present

## 2021-09-28 DIAGNOSIS — F419 Anxiety disorder, unspecified: Secondary | ICD-10-CM | POA: Diagnosis not present

## 2021-10-05 DIAGNOSIS — F840 Autistic disorder: Secondary | ICD-10-CM | POA: Diagnosis not present

## 2021-10-05 DIAGNOSIS — F3175 Bipolar disorder, in partial remission, most recent episode depressed: Secondary | ICD-10-CM | POA: Diagnosis not present

## 2021-10-05 DIAGNOSIS — F419 Anxiety disorder, unspecified: Secondary | ICD-10-CM | POA: Diagnosis not present

## 2021-10-05 DIAGNOSIS — F902 Attention-deficit hyperactivity disorder, combined type: Secondary | ICD-10-CM | POA: Diagnosis not present

## 2021-10-07 DIAGNOSIS — E221 Hyperprolactinemia: Secondary | ICD-10-CM | POA: Diagnosis not present

## 2021-10-07 DIAGNOSIS — F410 Panic disorder [episodic paroxysmal anxiety] without agoraphobia: Secondary | ICD-10-CM | POA: Diagnosis not present

## 2021-10-07 DIAGNOSIS — G4700 Insomnia, unspecified: Secondary | ICD-10-CM | POA: Diagnosis not present

## 2021-10-07 DIAGNOSIS — F419 Anxiety disorder, unspecified: Secondary | ICD-10-CM | POA: Diagnosis not present

## 2021-10-12 DIAGNOSIS — F902 Attention-deficit hyperactivity disorder, combined type: Secondary | ICD-10-CM | POA: Diagnosis not present

## 2021-10-12 DIAGNOSIS — F840 Autistic disorder: Secondary | ICD-10-CM | POA: Diagnosis not present

## 2021-10-12 DIAGNOSIS — F419 Anxiety disorder, unspecified: Secondary | ICD-10-CM | POA: Diagnosis not present

## 2021-10-12 DIAGNOSIS — F3175 Bipolar disorder, in partial remission, most recent episode depressed: Secondary | ICD-10-CM | POA: Diagnosis not present

## 2021-10-19 DIAGNOSIS — F3175 Bipolar disorder, in partial remission, most recent episode depressed: Secondary | ICD-10-CM | POA: Diagnosis not present

## 2021-10-19 DIAGNOSIS — F840 Autistic disorder: Secondary | ICD-10-CM | POA: Diagnosis not present

## 2021-10-19 DIAGNOSIS — F419 Anxiety disorder, unspecified: Secondary | ICD-10-CM | POA: Diagnosis not present

## 2021-10-19 DIAGNOSIS — F902 Attention-deficit hyperactivity disorder, combined type: Secondary | ICD-10-CM | POA: Diagnosis not present

## 2021-10-22 DIAGNOSIS — F902 Attention-deficit hyperactivity disorder, combined type: Secondary | ICD-10-CM | POA: Diagnosis not present

## 2021-10-22 DIAGNOSIS — F840 Autistic disorder: Secondary | ICD-10-CM | POA: Diagnosis not present

## 2021-10-22 DIAGNOSIS — F3175 Bipolar disorder, in partial remission, most recent episode depressed: Secondary | ICD-10-CM | POA: Diagnosis not present

## 2021-10-22 DIAGNOSIS — F419 Anxiety disorder, unspecified: Secondary | ICD-10-CM | POA: Diagnosis not present

## 2021-10-26 DIAGNOSIS — F840 Autistic disorder: Secondary | ICD-10-CM | POA: Diagnosis not present

## 2021-10-26 DIAGNOSIS — F902 Attention-deficit hyperactivity disorder, combined type: Secondary | ICD-10-CM | POA: Diagnosis not present

## 2021-10-26 DIAGNOSIS — F419 Anxiety disorder, unspecified: Secondary | ICD-10-CM | POA: Diagnosis not present

## 2021-10-26 DIAGNOSIS — F3175 Bipolar disorder, in partial remission, most recent episode depressed: Secondary | ICD-10-CM | POA: Diagnosis not present

## 2021-11-03 DIAGNOSIS — F840 Autistic disorder: Secondary | ICD-10-CM | POA: Diagnosis not present

## 2021-11-03 DIAGNOSIS — F419 Anxiety disorder, unspecified: Secondary | ICD-10-CM | POA: Diagnosis not present

## 2021-11-03 DIAGNOSIS — F3175 Bipolar disorder, in partial remission, most recent episode depressed: Secondary | ICD-10-CM | POA: Diagnosis not present

## 2021-11-03 DIAGNOSIS — F902 Attention-deficit hyperactivity disorder, combined type: Secondary | ICD-10-CM | POA: Diagnosis not present

## 2021-11-10 DIAGNOSIS — F3175 Bipolar disorder, in partial remission, most recent episode depressed: Secondary | ICD-10-CM | POA: Diagnosis not present

## 2021-11-10 DIAGNOSIS — F419 Anxiety disorder, unspecified: Secondary | ICD-10-CM | POA: Diagnosis not present

## 2021-11-10 DIAGNOSIS — F840 Autistic disorder: Secondary | ICD-10-CM | POA: Diagnosis not present

## 2021-11-10 DIAGNOSIS — F902 Attention-deficit hyperactivity disorder, combined type: Secondary | ICD-10-CM | POA: Diagnosis not present

## 2021-11-18 DIAGNOSIS — F840 Autistic disorder: Secondary | ICD-10-CM | POA: Diagnosis not present

## 2021-11-18 DIAGNOSIS — F3175 Bipolar disorder, in partial remission, most recent episode depressed: Secondary | ICD-10-CM | POA: Diagnosis not present

## 2021-11-18 DIAGNOSIS — F419 Anxiety disorder, unspecified: Secondary | ICD-10-CM | POA: Diagnosis not present

## 2021-11-18 DIAGNOSIS — F902 Attention-deficit hyperactivity disorder, combined type: Secondary | ICD-10-CM | POA: Diagnosis not present

## 2021-11-26 DIAGNOSIS — F419 Anxiety disorder, unspecified: Secondary | ICD-10-CM | POA: Diagnosis not present

## 2021-11-26 DIAGNOSIS — F902 Attention-deficit hyperactivity disorder, combined type: Secondary | ICD-10-CM | POA: Diagnosis not present

## 2021-11-26 DIAGNOSIS — F840 Autistic disorder: Secondary | ICD-10-CM | POA: Diagnosis not present

## 2021-11-26 DIAGNOSIS — F3175 Bipolar disorder, in partial remission, most recent episode depressed: Secondary | ICD-10-CM | POA: Diagnosis not present

## 2021-12-02 DIAGNOSIS — F840 Autistic disorder: Secondary | ICD-10-CM | POA: Diagnosis not present

## 2021-12-02 DIAGNOSIS — F3175 Bipolar disorder, in partial remission, most recent episode depressed: Secondary | ICD-10-CM | POA: Diagnosis not present

## 2021-12-02 DIAGNOSIS — F419 Anxiety disorder, unspecified: Secondary | ICD-10-CM | POA: Diagnosis not present

## 2021-12-02 DIAGNOSIS — F902 Attention-deficit hyperactivity disorder, combined type: Secondary | ICD-10-CM | POA: Diagnosis not present

## 2021-12-09 DIAGNOSIS — F902 Attention-deficit hyperactivity disorder, combined type: Secondary | ICD-10-CM | POA: Diagnosis not present

## 2021-12-09 DIAGNOSIS — F840 Autistic disorder: Secondary | ICD-10-CM | POA: Diagnosis not present

## 2021-12-09 DIAGNOSIS — F419 Anxiety disorder, unspecified: Secondary | ICD-10-CM | POA: Diagnosis not present

## 2021-12-09 DIAGNOSIS — F3175 Bipolar disorder, in partial remission, most recent episode depressed: Secondary | ICD-10-CM | POA: Diagnosis not present

## 2021-12-15 DIAGNOSIS — F419 Anxiety disorder, unspecified: Secondary | ICD-10-CM | POA: Diagnosis not present

## 2021-12-15 DIAGNOSIS — F3175 Bipolar disorder, in partial remission, most recent episode depressed: Secondary | ICD-10-CM | POA: Diagnosis not present

## 2021-12-15 DIAGNOSIS — F410 Panic disorder [episodic paroxysmal anxiety] without agoraphobia: Secondary | ICD-10-CM | POA: Diagnosis not present

## 2021-12-15 DIAGNOSIS — F902 Attention-deficit hyperactivity disorder, combined type: Secondary | ICD-10-CM | POA: Diagnosis not present

## 2021-12-30 DIAGNOSIS — F840 Autistic disorder: Secondary | ICD-10-CM | POA: Diagnosis not present

## 2021-12-30 DIAGNOSIS — F3175 Bipolar disorder, in partial remission, most recent episode depressed: Secondary | ICD-10-CM | POA: Diagnosis not present

## 2021-12-30 DIAGNOSIS — F902 Attention-deficit hyperactivity disorder, combined type: Secondary | ICD-10-CM | POA: Diagnosis not present

## 2021-12-30 DIAGNOSIS — F419 Anxiety disorder, unspecified: Secondary | ICD-10-CM | POA: Diagnosis not present

## 2022-01-03 DIAGNOSIS — R42 Dizziness and giddiness: Secondary | ICD-10-CM | POA: Diagnosis not present

## 2022-01-03 DIAGNOSIS — R251 Tremor, unspecified: Secondary | ICD-10-CM | POA: Diagnosis not present

## 2022-01-03 DIAGNOSIS — R233 Spontaneous ecchymoses: Secondary | ICD-10-CM | POA: Diagnosis not present

## 2022-01-06 DIAGNOSIS — F419 Anxiety disorder, unspecified: Secondary | ICD-10-CM | POA: Diagnosis not present

## 2022-01-06 DIAGNOSIS — F840 Autistic disorder: Secondary | ICD-10-CM | POA: Diagnosis not present

## 2022-01-06 DIAGNOSIS — F902 Attention-deficit hyperactivity disorder, combined type: Secondary | ICD-10-CM | POA: Diagnosis not present

## 2022-01-06 DIAGNOSIS — F3175 Bipolar disorder, in partial remission, most recent episode depressed: Secondary | ICD-10-CM | POA: Diagnosis not present

## 2022-01-12 DIAGNOSIS — F410 Panic disorder [episodic paroxysmal anxiety] without agoraphobia: Secondary | ICD-10-CM | POA: Diagnosis not present

## 2022-01-12 DIAGNOSIS — F419 Anxiety disorder, unspecified: Secondary | ICD-10-CM | POA: Diagnosis not present

## 2022-01-12 DIAGNOSIS — E221 Hyperprolactinemia: Secondary | ICD-10-CM | POA: Diagnosis not present

## 2022-01-12 DIAGNOSIS — G4700 Insomnia, unspecified: Secondary | ICD-10-CM | POA: Diagnosis not present

## 2022-01-13 DIAGNOSIS — F902 Attention-deficit hyperactivity disorder, combined type: Secondary | ICD-10-CM | POA: Diagnosis not present

## 2022-01-13 DIAGNOSIS — F840 Autistic disorder: Secondary | ICD-10-CM | POA: Diagnosis not present

## 2022-01-13 DIAGNOSIS — F419 Anxiety disorder, unspecified: Secondary | ICD-10-CM | POA: Diagnosis not present

## 2022-01-13 DIAGNOSIS — F3175 Bipolar disorder, in partial remission, most recent episode depressed: Secondary | ICD-10-CM | POA: Diagnosis not present

## 2022-01-14 DIAGNOSIS — M542 Cervicalgia: Secondary | ICD-10-CM | POA: Diagnosis not present

## 2022-01-17 DIAGNOSIS — R635 Abnormal weight gain: Secondary | ICD-10-CM | POA: Diagnosis not present

## 2022-01-17 DIAGNOSIS — E785 Hyperlipidemia, unspecified: Secondary | ICD-10-CM | POA: Diagnosis not present

## 2022-01-17 DIAGNOSIS — N951 Menopausal and female climacteric states: Secondary | ICD-10-CM | POA: Diagnosis not present

## 2022-01-17 DIAGNOSIS — E1165 Type 2 diabetes mellitus with hyperglycemia: Secondary | ICD-10-CM | POA: Diagnosis not present

## 2022-01-17 DIAGNOSIS — N910 Primary amenorrhea: Secondary | ICD-10-CM | POA: Diagnosis not present

## 2022-01-17 DIAGNOSIS — R7309 Other abnormal glucose: Secondary | ICD-10-CM | POA: Diagnosis not present

## 2022-01-20 DIAGNOSIS — F902 Attention-deficit hyperactivity disorder, combined type: Secondary | ICD-10-CM | POA: Diagnosis not present

## 2022-01-20 DIAGNOSIS — F419 Anxiety disorder, unspecified: Secondary | ICD-10-CM | POA: Diagnosis not present

## 2022-01-20 DIAGNOSIS — F3175 Bipolar disorder, in partial remission, most recent episode depressed: Secondary | ICD-10-CM | POA: Diagnosis not present

## 2022-01-20 DIAGNOSIS — F840 Autistic disorder: Secondary | ICD-10-CM | POA: Diagnosis not present

## 2022-01-24 DIAGNOSIS — Z1331 Encounter for screening for depression: Secondary | ICD-10-CM | POA: Diagnosis not present

## 2022-01-24 DIAGNOSIS — E8881 Metabolic syndrome: Secondary | ICD-10-CM | POA: Diagnosis not present

## 2022-01-24 DIAGNOSIS — Z1339 Encounter for screening examination for other mental health and behavioral disorders: Secondary | ICD-10-CM | POA: Diagnosis not present

## 2022-01-24 DIAGNOSIS — E1165 Type 2 diabetes mellitus with hyperglycemia: Secondary | ICD-10-CM | POA: Diagnosis not present

## 2022-01-24 DIAGNOSIS — N951 Menopausal and female climacteric states: Secondary | ICD-10-CM | POA: Diagnosis not present

## 2022-01-24 DIAGNOSIS — E221 Hyperprolactinemia: Secondary | ICD-10-CM | POA: Diagnosis not present

## 2022-01-25 DIAGNOSIS — Z1881 Retained glass fragments: Secondary | ICD-10-CM | POA: Diagnosis not present

## 2022-01-25 DIAGNOSIS — M79671 Pain in right foot: Secondary | ICD-10-CM | POA: Diagnosis not present

## 2022-01-25 DIAGNOSIS — S90851A Superficial foreign body, right foot, initial encounter: Secondary | ICD-10-CM | POA: Diagnosis not present

## 2022-01-25 DIAGNOSIS — S91331A Puncture wound without foreign body, right foot, initial encounter: Secondary | ICD-10-CM | POA: Diagnosis not present

## 2022-01-26 DIAGNOSIS — H903 Sensorineural hearing loss, bilateral: Secondary | ICD-10-CM | POA: Diagnosis not present

## 2022-01-27 DIAGNOSIS — F3175 Bipolar disorder, in partial remission, most recent episode depressed: Secondary | ICD-10-CM | POA: Diagnosis not present

## 2022-01-27 DIAGNOSIS — F902 Attention-deficit hyperactivity disorder, combined type: Secondary | ICD-10-CM | POA: Diagnosis not present

## 2022-01-27 DIAGNOSIS — F840 Autistic disorder: Secondary | ICD-10-CM | POA: Diagnosis not present

## 2022-01-27 DIAGNOSIS — F419 Anxiety disorder, unspecified: Secondary | ICD-10-CM | POA: Diagnosis not present

## 2022-02-01 DIAGNOSIS — E7800 Pure hypercholesterolemia, unspecified: Secondary | ICD-10-CM | POA: Diagnosis not present

## 2022-02-03 DIAGNOSIS — F902 Attention-deficit hyperactivity disorder, combined type: Secondary | ICD-10-CM | POA: Diagnosis not present

## 2022-02-03 DIAGNOSIS — F3175 Bipolar disorder, in partial remission, most recent episode depressed: Secondary | ICD-10-CM | POA: Diagnosis not present

## 2022-02-03 DIAGNOSIS — F419 Anxiety disorder, unspecified: Secondary | ICD-10-CM | POA: Diagnosis not present

## 2022-02-03 DIAGNOSIS — F840 Autistic disorder: Secondary | ICD-10-CM | POA: Diagnosis not present

## 2022-02-04 DIAGNOSIS — Z01419 Encounter for gynecological examination (general) (routine) without abnormal findings: Secondary | ICD-10-CM | POA: Diagnosis not present

## 2022-02-04 DIAGNOSIS — Z124 Encounter for screening for malignant neoplasm of cervix: Secondary | ICD-10-CM | POA: Diagnosis not present

## 2022-02-04 DIAGNOSIS — Z3046 Encounter for surveillance of implantable subdermal contraceptive: Secondary | ICD-10-CM | POA: Diagnosis not present

## 2022-02-04 DIAGNOSIS — Z6826 Body mass index (BMI) 26.0-26.9, adult: Secondary | ICD-10-CM | POA: Diagnosis not present

## 2022-02-08 DIAGNOSIS — E8881 Metabolic syndrome: Secondary | ICD-10-CM | POA: Diagnosis not present

## 2022-02-08 DIAGNOSIS — Z6826 Body mass index (BMI) 26.0-26.9, adult: Secondary | ICD-10-CM | POA: Diagnosis not present

## 2022-02-09 DIAGNOSIS — F410 Panic disorder [episodic paroxysmal anxiety] without agoraphobia: Secondary | ICD-10-CM | POA: Diagnosis not present

## 2022-02-09 DIAGNOSIS — E221 Hyperprolactinemia: Secondary | ICD-10-CM | POA: Diagnosis not present

## 2022-02-09 DIAGNOSIS — G4700 Insomnia, unspecified: Secondary | ICD-10-CM | POA: Diagnosis not present

## 2022-02-09 DIAGNOSIS — F419 Anxiety disorder, unspecified: Secondary | ICD-10-CM | POA: Diagnosis not present

## 2022-02-10 DIAGNOSIS — F3175 Bipolar disorder, in partial remission, most recent episode depressed: Secondary | ICD-10-CM | POA: Diagnosis not present

## 2022-02-10 DIAGNOSIS — F840 Autistic disorder: Secondary | ICD-10-CM | POA: Diagnosis not present

## 2022-02-10 DIAGNOSIS — F419 Anxiety disorder, unspecified: Secondary | ICD-10-CM | POA: Diagnosis not present

## 2022-02-10 DIAGNOSIS — F902 Attention-deficit hyperactivity disorder, combined type: Secondary | ICD-10-CM | POA: Diagnosis not present

## 2022-02-14 DIAGNOSIS — E221 Hyperprolactinemia: Secondary | ICD-10-CM | POA: Diagnosis not present

## 2022-02-14 DIAGNOSIS — G4700 Insomnia, unspecified: Secondary | ICD-10-CM | POA: Diagnosis not present

## 2022-02-14 DIAGNOSIS — F419 Anxiety disorder, unspecified: Secondary | ICD-10-CM | POA: Diagnosis not present

## 2022-02-14 DIAGNOSIS — F410 Panic disorder [episodic paroxysmal anxiety] without agoraphobia: Secondary | ICD-10-CM | POA: Diagnosis not present

## 2022-02-16 DIAGNOSIS — F419 Anxiety disorder, unspecified: Secondary | ICD-10-CM | POA: Diagnosis not present

## 2022-02-16 DIAGNOSIS — F902 Attention-deficit hyperactivity disorder, combined type: Secondary | ICD-10-CM | POA: Diagnosis not present

## 2022-02-16 DIAGNOSIS — F840 Autistic disorder: Secondary | ICD-10-CM | POA: Diagnosis not present

## 2022-02-16 DIAGNOSIS — F3175 Bipolar disorder, in partial remission, most recent episode depressed: Secondary | ICD-10-CM | POA: Diagnosis not present

## 2022-02-17 DIAGNOSIS — F840 Autistic disorder: Secondary | ICD-10-CM | POA: Diagnosis not present

## 2022-02-17 DIAGNOSIS — F3175 Bipolar disorder, in partial remission, most recent episode depressed: Secondary | ICD-10-CM | POA: Diagnosis not present

## 2022-02-17 DIAGNOSIS — F419 Anxiety disorder, unspecified: Secondary | ICD-10-CM | POA: Diagnosis not present

## 2022-02-17 DIAGNOSIS — F902 Attention-deficit hyperactivity disorder, combined type: Secondary | ICD-10-CM | POA: Diagnosis not present

## 2022-02-18 DIAGNOSIS — R5382 Chronic fatigue, unspecified: Secondary | ICD-10-CM | POA: Diagnosis not present

## 2022-02-18 DIAGNOSIS — K219 Gastro-esophageal reflux disease without esophagitis: Secondary | ICD-10-CM | POA: Diagnosis not present

## 2022-02-18 DIAGNOSIS — N912 Amenorrhea, unspecified: Secondary | ICD-10-CM | POA: Diagnosis not present

## 2022-02-18 DIAGNOSIS — K5900 Constipation, unspecified: Secondary | ICD-10-CM | POA: Diagnosis not present

## 2022-02-22 DIAGNOSIS — E039 Hypothyroidism, unspecified: Secondary | ICD-10-CM | POA: Diagnosis not present

## 2022-02-22 DIAGNOSIS — Z6826 Body mass index (BMI) 26.0-26.9, adult: Secondary | ICD-10-CM | POA: Diagnosis not present

## 2022-02-22 DIAGNOSIS — Z130 Encounter for screening for diseases of the blood and blood-forming organs and certain disorders involving the immune mechanism: Secondary | ICD-10-CM | POA: Diagnosis not present

## 2022-02-22 DIAGNOSIS — N951 Menopausal and female climacteric states: Secondary | ICD-10-CM | POA: Diagnosis not present

## 2022-02-22 DIAGNOSIS — E785 Hyperlipidemia, unspecified: Secondary | ICD-10-CM | POA: Diagnosis not present

## 2022-02-24 DIAGNOSIS — F3175 Bipolar disorder, in partial remission, most recent episode depressed: Secondary | ICD-10-CM | POA: Diagnosis not present

## 2022-02-24 DIAGNOSIS — F840 Autistic disorder: Secondary | ICD-10-CM | POA: Diagnosis not present

## 2022-02-24 DIAGNOSIS — F902 Attention-deficit hyperactivity disorder, combined type: Secondary | ICD-10-CM | POA: Diagnosis not present

## 2022-02-24 DIAGNOSIS — F419 Anxiety disorder, unspecified: Secondary | ICD-10-CM | POA: Diagnosis not present

## 2022-02-25 DIAGNOSIS — K219 Gastro-esophageal reflux disease without esophagitis: Secondary | ICD-10-CM | POA: Diagnosis not present

## 2022-02-25 DIAGNOSIS — R945 Abnormal results of liver function studies: Secondary | ICD-10-CM | POA: Diagnosis not present

## 2022-02-25 DIAGNOSIS — I10 Essential (primary) hypertension: Secondary | ICD-10-CM | POA: Diagnosis not present

## 2022-02-25 DIAGNOSIS — E1169 Type 2 diabetes mellitus with other specified complication: Secondary | ICD-10-CM | POA: Diagnosis not present

## 2022-03-01 DIAGNOSIS — N912 Amenorrhea, unspecified: Secondary | ICD-10-CM | POA: Diagnosis not present

## 2022-03-01 DIAGNOSIS — R5382 Chronic fatigue, unspecified: Secondary | ICD-10-CM | POA: Diagnosis not present

## 2022-03-01 DIAGNOSIS — K219 Gastro-esophageal reflux disease without esophagitis: Secondary | ICD-10-CM | POA: Diagnosis not present

## 2022-03-01 DIAGNOSIS — L700 Acne vulgaris: Secondary | ICD-10-CM | POA: Diagnosis not present

## 2022-03-03 DIAGNOSIS — F902 Attention-deficit hyperactivity disorder, combined type: Secondary | ICD-10-CM | POA: Diagnosis not present

## 2022-03-03 DIAGNOSIS — F840 Autistic disorder: Secondary | ICD-10-CM | POA: Diagnosis not present

## 2022-03-03 DIAGNOSIS — F419 Anxiety disorder, unspecified: Secondary | ICD-10-CM | POA: Diagnosis not present

## 2022-03-03 DIAGNOSIS — F3175 Bipolar disorder, in partial remission, most recent episode depressed: Secondary | ICD-10-CM | POA: Diagnosis not present

## 2022-03-10 DIAGNOSIS — F3175 Bipolar disorder, in partial remission, most recent episode depressed: Secondary | ICD-10-CM | POA: Diagnosis not present

## 2022-03-10 DIAGNOSIS — F419 Anxiety disorder, unspecified: Secondary | ICD-10-CM | POA: Diagnosis not present

## 2022-03-10 DIAGNOSIS — F902 Attention-deficit hyperactivity disorder, combined type: Secondary | ICD-10-CM | POA: Diagnosis not present

## 2022-03-10 DIAGNOSIS — F840 Autistic disorder: Secondary | ICD-10-CM | POA: Diagnosis not present

## 2022-03-15 DIAGNOSIS — M542 Cervicalgia: Secondary | ICD-10-CM | POA: Diagnosis not present

## 2022-03-16 DIAGNOSIS — F419 Anxiety disorder, unspecified: Secondary | ICD-10-CM | POA: Diagnosis not present

## 2022-03-16 DIAGNOSIS — G4700 Insomnia, unspecified: Secondary | ICD-10-CM | POA: Diagnosis not present

## 2022-03-16 DIAGNOSIS — F410 Panic disorder [episodic paroxysmal anxiety] without agoraphobia: Secondary | ICD-10-CM | POA: Diagnosis not present

## 2022-03-16 DIAGNOSIS — E221 Hyperprolactinemia: Secondary | ICD-10-CM | POA: Diagnosis not present

## 2022-03-17 DIAGNOSIS — F902 Attention-deficit hyperactivity disorder, combined type: Secondary | ICD-10-CM | POA: Diagnosis not present

## 2022-03-17 DIAGNOSIS — F419 Anxiety disorder, unspecified: Secondary | ICD-10-CM | POA: Diagnosis not present

## 2022-03-17 DIAGNOSIS — F840 Autistic disorder: Secondary | ICD-10-CM | POA: Diagnosis not present

## 2022-03-17 DIAGNOSIS — F3175 Bipolar disorder, in partial remission, most recent episode depressed: Secondary | ICD-10-CM | POA: Diagnosis not present

## 2022-03-24 DIAGNOSIS — F902 Attention-deficit hyperactivity disorder, combined type: Secondary | ICD-10-CM | POA: Diagnosis not present

## 2022-03-24 DIAGNOSIS — F840 Autistic disorder: Secondary | ICD-10-CM | POA: Diagnosis not present

## 2022-03-24 DIAGNOSIS — F419 Anxiety disorder, unspecified: Secondary | ICD-10-CM | POA: Diagnosis not present

## 2022-03-24 DIAGNOSIS — F3175 Bipolar disorder, in partial remission, most recent episode depressed: Secondary | ICD-10-CM | POA: Diagnosis not present

## 2022-03-31 DIAGNOSIS — F902 Attention-deficit hyperactivity disorder, combined type: Secondary | ICD-10-CM | POA: Diagnosis not present

## 2022-03-31 DIAGNOSIS — F840 Autistic disorder: Secondary | ICD-10-CM | POA: Diagnosis not present

## 2022-03-31 DIAGNOSIS — F3175 Bipolar disorder, in partial remission, most recent episode depressed: Secondary | ICD-10-CM | POA: Diagnosis not present

## 2022-03-31 DIAGNOSIS — F419 Anxiety disorder, unspecified: Secondary | ICD-10-CM | POA: Diagnosis not present

## 2022-04-07 DIAGNOSIS — F3175 Bipolar disorder, in partial remission, most recent episode depressed: Secondary | ICD-10-CM | POA: Diagnosis not present

## 2022-04-07 DIAGNOSIS — F419 Anxiety disorder, unspecified: Secondary | ICD-10-CM | POA: Diagnosis not present

## 2022-04-07 DIAGNOSIS — F902 Attention-deficit hyperactivity disorder, combined type: Secondary | ICD-10-CM | POA: Diagnosis not present

## 2022-04-07 DIAGNOSIS — F840 Autistic disorder: Secondary | ICD-10-CM | POA: Diagnosis not present

## 2022-04-13 DIAGNOSIS — F419 Anxiety disorder, unspecified: Secondary | ICD-10-CM | POA: Diagnosis not present

## 2022-04-13 DIAGNOSIS — E221 Hyperprolactinemia: Secondary | ICD-10-CM | POA: Diagnosis not present

## 2022-04-13 DIAGNOSIS — F410 Panic disorder [episodic paroxysmal anxiety] without agoraphobia: Secondary | ICD-10-CM | POA: Diagnosis not present

## 2022-04-13 DIAGNOSIS — G4700 Insomnia, unspecified: Secondary | ICD-10-CM | POA: Diagnosis not present

## 2022-04-20 DIAGNOSIS — E221 Hyperprolactinemia: Secondary | ICD-10-CM | POA: Diagnosis not present

## 2022-04-20 DIAGNOSIS — F410 Panic disorder [episodic paroxysmal anxiety] without agoraphobia: Secondary | ICD-10-CM | POA: Diagnosis not present

## 2022-04-20 DIAGNOSIS — G4700 Insomnia, unspecified: Secondary | ICD-10-CM | POA: Diagnosis not present

## 2022-04-20 DIAGNOSIS — F419 Anxiety disorder, unspecified: Secondary | ICD-10-CM | POA: Diagnosis not present

## 2022-04-21 ENCOUNTER — Encounter: Payer: Self-pay | Admitting: Gastroenterology

## 2022-04-21 DIAGNOSIS — F419 Anxiety disorder, unspecified: Secondary | ICD-10-CM | POA: Diagnosis not present

## 2022-04-21 DIAGNOSIS — F3175 Bipolar disorder, in partial remission, most recent episode depressed: Secondary | ICD-10-CM | POA: Diagnosis not present

## 2022-04-21 DIAGNOSIS — F840 Autistic disorder: Secondary | ICD-10-CM | POA: Diagnosis not present

## 2022-04-21 DIAGNOSIS — F902 Attention-deficit hyperactivity disorder, combined type: Secondary | ICD-10-CM | POA: Diagnosis not present

## 2022-04-25 DIAGNOSIS — F419 Anxiety disorder, unspecified: Secondary | ICD-10-CM | POA: Diagnosis not present

## 2022-04-25 DIAGNOSIS — G4700 Insomnia, unspecified: Secondary | ICD-10-CM | POA: Diagnosis not present

## 2022-04-25 DIAGNOSIS — F410 Panic disorder [episodic paroxysmal anxiety] without agoraphobia: Secondary | ICD-10-CM | POA: Diagnosis not present

## 2022-04-25 DIAGNOSIS — E221 Hyperprolactinemia: Secondary | ICD-10-CM | POA: Diagnosis not present

## 2022-04-28 DIAGNOSIS — F840 Autistic disorder: Secondary | ICD-10-CM | POA: Diagnosis not present

## 2022-04-28 DIAGNOSIS — F902 Attention-deficit hyperactivity disorder, combined type: Secondary | ICD-10-CM | POA: Diagnosis not present

## 2022-04-28 DIAGNOSIS — F3175 Bipolar disorder, in partial remission, most recent episode depressed: Secondary | ICD-10-CM | POA: Diagnosis not present

## 2022-04-28 DIAGNOSIS — F419 Anxiety disorder, unspecified: Secondary | ICD-10-CM | POA: Diagnosis not present

## 2022-05-02 DIAGNOSIS — F3175 Bipolar disorder, in partial remission, most recent episode depressed: Secondary | ICD-10-CM | POA: Diagnosis not present

## 2022-05-02 DIAGNOSIS — F902 Attention-deficit hyperactivity disorder, combined type: Secondary | ICD-10-CM | POA: Diagnosis not present

## 2022-05-02 DIAGNOSIS — F410 Panic disorder [episodic paroxysmal anxiety] without agoraphobia: Secondary | ICD-10-CM | POA: Diagnosis not present

## 2022-05-02 DIAGNOSIS — F419 Anxiety disorder, unspecified: Secondary | ICD-10-CM | POA: Diagnosis not present

## 2022-05-05 DIAGNOSIS — F840 Autistic disorder: Secondary | ICD-10-CM | POA: Diagnosis not present

## 2022-05-05 DIAGNOSIS — F3175 Bipolar disorder, in partial remission, most recent episode depressed: Secondary | ICD-10-CM | POA: Diagnosis not present

## 2022-05-05 DIAGNOSIS — F902 Attention-deficit hyperactivity disorder, combined type: Secondary | ICD-10-CM | POA: Diagnosis not present

## 2022-05-05 DIAGNOSIS — F419 Anxiety disorder, unspecified: Secondary | ICD-10-CM | POA: Diagnosis not present

## 2022-05-10 ENCOUNTER — Telehealth: Payer: Self-pay

## 2022-05-10 NOTE — Patient Outreach (Signed)
  Care Coordination   Initial Visit Note   05/10/2022 Name: Alyssa Sanford MRN: [id number] DOB: [birth year]  Alyssa Sanford is a 26 y.o. year old female who sees Patient, No Pcp Per for primary care. I spoke with  Alyssa Sanford by phone today.  What matters to the patients health and wellness today?  I need to think about if I need anything.  Did not provide HIPAA    Goals Addressed   None     SDOH assessments and interventions completed:  No     Care Coordination Interventions Activated:  No  Care Coordination Interventions:  No, not indicated   Follow up plan: No further intervention required.   Encounter Outcome:  Pt. Refused {THN Tip this will not be part of the note when signed-REQUIRED REPORT FIELD DO NOT DELETE (Optional):[id number] Peter Garter RN, BSN,CCM, [facility] [phone]

## 2022-05-11 DIAGNOSIS — F410 Panic disorder [episodic paroxysmal anxiety] without agoraphobia: Secondary | ICD-10-CM | POA: Diagnosis not present

## 2022-05-11 DIAGNOSIS — F419 Anxiety disorder, unspecified: Secondary | ICD-10-CM | POA: Diagnosis not present

## 2022-05-11 DIAGNOSIS — F3175 Bipolar disorder, in partial remission, most recent episode depressed: Secondary | ICD-10-CM | POA: Diagnosis not present

## 2022-05-11 DIAGNOSIS — F902 Attention-deficit hyperactivity disorder, combined type: Secondary | ICD-10-CM | POA: Diagnosis not present

## 2022-05-12 DIAGNOSIS — F902 Attention-deficit hyperactivity disorder, combined type: Secondary | ICD-10-CM | POA: Diagnosis not present

## 2022-05-12 DIAGNOSIS — F840 Autistic disorder: Secondary | ICD-10-CM | POA: Diagnosis not present

## 2022-05-12 DIAGNOSIS — F419 Anxiety disorder, unspecified: Secondary | ICD-10-CM | POA: Diagnosis not present

## 2022-05-12 DIAGNOSIS — F3175 Bipolar disorder, in partial remission, most recent episode depressed: Secondary | ICD-10-CM | POA: Diagnosis not present

## 2022-05-19 DIAGNOSIS — F902 Attention-deficit hyperactivity disorder, combined type: Secondary | ICD-10-CM | POA: Diagnosis not present

## 2022-05-19 DIAGNOSIS — F3175 Bipolar disorder, in partial remission, most recent episode depressed: Secondary | ICD-10-CM | POA: Diagnosis not present

## 2022-05-19 DIAGNOSIS — F840 Autistic disorder: Secondary | ICD-10-CM | POA: Diagnosis not present

## 2022-05-19 DIAGNOSIS — F419 Anxiety disorder, unspecified: Secondary | ICD-10-CM | POA: Diagnosis not present

## 2022-05-24 DIAGNOSIS — F419 Anxiety disorder, unspecified: Secondary | ICD-10-CM | POA: Diagnosis not present

## 2022-05-24 DIAGNOSIS — F3175 Bipolar disorder, in partial remission, most recent episode depressed: Secondary | ICD-10-CM | POA: Diagnosis not present

## 2022-05-24 DIAGNOSIS — F840 Autistic disorder: Secondary | ICD-10-CM | POA: Diagnosis not present

## 2022-05-24 DIAGNOSIS — F902 Attention-deficit hyperactivity disorder, combined type: Secondary | ICD-10-CM | POA: Diagnosis not present

## 2022-05-25 DIAGNOSIS — F419 Anxiety disorder, unspecified: Secondary | ICD-10-CM | POA: Diagnosis not present

## 2022-05-25 DIAGNOSIS — F410 Panic disorder [episodic paroxysmal anxiety] without agoraphobia: Secondary | ICD-10-CM | POA: Diagnosis not present

## 2022-05-25 DIAGNOSIS — F902 Attention-deficit hyperactivity disorder, combined type: Secondary | ICD-10-CM | POA: Diagnosis not present

## 2022-05-25 DIAGNOSIS — F3175 Bipolar disorder, in partial remission, most recent episode depressed: Secondary | ICD-10-CM | POA: Diagnosis not present

## 2022-06-01 DIAGNOSIS — G4700 Insomnia, unspecified: Secondary | ICD-10-CM | POA: Diagnosis not present

## 2022-06-01 DIAGNOSIS — E221 Hyperprolactinemia: Secondary | ICD-10-CM | POA: Diagnosis not present

## 2022-06-01 DIAGNOSIS — F410 Panic disorder [episodic paroxysmal anxiety] without agoraphobia: Secondary | ICD-10-CM | POA: Diagnosis not present

## 2022-06-01 DIAGNOSIS — F419 Anxiety disorder, unspecified: Secondary | ICD-10-CM | POA: Diagnosis not present

## 2022-06-02 DIAGNOSIS — F840 Autistic disorder: Secondary | ICD-10-CM | POA: Diagnosis not present

## 2022-06-02 DIAGNOSIS — F3175 Bipolar disorder, in partial remission, most recent episode depressed: Secondary | ICD-10-CM | POA: Diagnosis not present

## 2022-06-02 DIAGNOSIS — F902 Attention-deficit hyperactivity disorder, combined type: Secondary | ICD-10-CM | POA: Diagnosis not present

## 2022-06-02 DIAGNOSIS — F419 Anxiety disorder, unspecified: Secondary | ICD-10-CM | POA: Diagnosis not present

## 2022-06-07 ENCOUNTER — Ambulatory Visit: Payer: Self-pay | Admitting: Gastroenterology

## 2022-06-08 DIAGNOSIS — F419 Anxiety disorder, unspecified: Secondary | ICD-10-CM | POA: Diagnosis not present

## 2022-06-08 DIAGNOSIS — G4700 Insomnia, unspecified: Secondary | ICD-10-CM | POA: Diagnosis not present

## 2022-06-08 DIAGNOSIS — E221 Hyperprolactinemia: Secondary | ICD-10-CM | POA: Diagnosis not present

## 2022-06-08 DIAGNOSIS — F410 Panic disorder [episodic paroxysmal anxiety] without agoraphobia: Secondary | ICD-10-CM | POA: Diagnosis not present

## 2022-06-09 DIAGNOSIS — R946 Abnormal results of thyroid function studies: Secondary | ICD-10-CM | POA: Diagnosis not present

## 2022-06-09 DIAGNOSIS — R197 Diarrhea, unspecified: Secondary | ICD-10-CM | POA: Diagnosis not present

## 2022-06-09 DIAGNOSIS — R109 Unspecified abdominal pain: Secondary | ICD-10-CM | POA: Diagnosis not present

## 2022-06-09 DIAGNOSIS — R140 Abdominal distension (gaseous): Secondary | ICD-10-CM | POA: Diagnosis not present

## 2022-06-09 DIAGNOSIS — R7989 Other specified abnormal findings of blood chemistry: Secondary | ICD-10-CM | POA: Diagnosis not present

## 2022-06-09 DIAGNOSIS — E1169 Type 2 diabetes mellitus with other specified complication: Secondary | ICD-10-CM | POA: Diagnosis not present

## 2022-06-09 DIAGNOSIS — R945 Abnormal results of liver function studies: Secondary | ICD-10-CM | POA: Diagnosis not present

## 2022-06-09 DIAGNOSIS — N289 Disorder of kidney and ureter, unspecified: Secondary | ICD-10-CM | POA: Diagnosis not present

## 2022-06-16 ENCOUNTER — Other Ambulatory Visit: Payer: Self-pay | Admitting: Family Medicine

## 2022-06-16 DIAGNOSIS — F902 Attention-deficit hyperactivity disorder, combined type: Secondary | ICD-10-CM | POA: Diagnosis not present

## 2022-06-16 DIAGNOSIS — F840 Autistic disorder: Secondary | ICD-10-CM | POA: Diagnosis not present

## 2022-06-16 DIAGNOSIS — F419 Anxiety disorder, unspecified: Secondary | ICD-10-CM | POA: Diagnosis not present

## 2022-06-16 DIAGNOSIS — R109 Unspecified abdominal pain: Secondary | ICD-10-CM

## 2022-06-16 DIAGNOSIS — F3175 Bipolar disorder, in partial remission, most recent episode depressed: Secondary | ICD-10-CM | POA: Diagnosis not present

## 2022-06-24 DIAGNOSIS — F419 Anxiety disorder, unspecified: Secondary | ICD-10-CM | POA: Diagnosis not present

## 2022-06-24 DIAGNOSIS — F410 Panic disorder [episodic paroxysmal anxiety] without agoraphobia: Secondary | ICD-10-CM | POA: Diagnosis not present

## 2022-06-24 DIAGNOSIS — E221 Hyperprolactinemia: Secondary | ICD-10-CM | POA: Diagnosis not present

## 2022-06-24 DIAGNOSIS — G4700 Insomnia, unspecified: Secondary | ICD-10-CM | POA: Diagnosis not present

## 2022-07-01 ENCOUNTER — Ambulatory Visit
Admission: RE | Admit: 2022-07-01 | Discharge: 2022-07-01 | Disposition: A | Discharge status: Home / Self Care | Payer: BC Managed Care – PPO | Source: Ambulatory Visit | Attending: Family Medicine | Admitting: Family Medicine

## 2022-07-01 DIAGNOSIS — K219 Gastro-esophageal reflux disease without esophagitis: Secondary | ICD-10-CM | POA: Diagnosis not present

## 2022-07-01 DIAGNOSIS — K760 Fatty (change of) liver, not elsewhere classified: Secondary | ICD-10-CM | POA: Diagnosis not present

## 2022-07-01 DIAGNOSIS — R109 Unspecified abdominal pain: Secondary | ICD-10-CM | POA: Diagnosis not present

## 2022-07-01 DIAGNOSIS — Z8507 Personal history of malignant neoplasm of pancreas: Secondary | ICD-10-CM | POA: Diagnosis not present

## 2022-07-01 DIAGNOSIS — R140 Abdominal distension (gaseous): Secondary | ICD-10-CM | POA: Diagnosis not present

## 2022-07-05 ENCOUNTER — Other Ambulatory Visit: Payer: Self-pay | Admitting: Family Medicine

## 2022-07-05 DIAGNOSIS — R935 Abnormal findings on diagnostic imaging of other abdominal regions, including retroperitoneum: Secondary | ICD-10-CM

## 2022-07-07 ENCOUNTER — Ambulatory Visit
Admission: RE | Admit: 2022-07-07 | Discharge: 2022-07-07 | Disposition: A | Discharge status: Home / Self Care | Payer: BC Managed Care – PPO | Source: Ambulatory Visit | Attending: Family Medicine | Admitting: Family Medicine

## 2022-07-07 DIAGNOSIS — F902 Attention-deficit hyperactivity disorder, combined type: Secondary | ICD-10-CM | POA: Diagnosis not present

## 2022-07-07 DIAGNOSIS — F3175 Bipolar disorder, in partial remission, most recent episode depressed: Secondary | ICD-10-CM | POA: Diagnosis not present

## 2022-07-07 DIAGNOSIS — R935 Abnormal findings on diagnostic imaging of other abdominal regions, including retroperitoneum: Secondary | ICD-10-CM

## 2022-07-07 DIAGNOSIS — F419 Anxiety disorder, unspecified: Secondary | ICD-10-CM | POA: Diagnosis not present

## 2022-07-07 DIAGNOSIS — F840 Autistic disorder: Secondary | ICD-10-CM | POA: Diagnosis not present

## 2022-07-07 DIAGNOSIS — K760 Fatty (change of) liver, not elsewhere classified: Secondary | ICD-10-CM | POA: Diagnosis not present

## 2022-07-07 MED ORDER — GADOPICLENOL 0.5 MMOL/ML IV SOLN
7.0000 mL | Freq: Once | INTRAVENOUS | Status: AC | PRN
  Administered 2022-07-07: 7 mL via INTRAVENOUS

## 2022-07-11 DIAGNOSIS — E221 Hyperprolactinemia: Secondary | ICD-10-CM | POA: Diagnosis not present

## 2022-07-11 DIAGNOSIS — F410 Panic disorder [episodic paroxysmal anxiety] without agoraphobia: Secondary | ICD-10-CM | POA: Diagnosis not present

## 2022-07-11 DIAGNOSIS — F419 Anxiety disorder, unspecified: Secondary | ICD-10-CM | POA: Diagnosis not present

## 2022-07-11 DIAGNOSIS — G4700 Insomnia, unspecified: Secondary | ICD-10-CM | POA: Diagnosis not present

## 2022-07-12 DIAGNOSIS — K769 Liver disease, unspecified: Secondary | ICD-10-CM | POA: Diagnosis not present

## 2022-07-12 DIAGNOSIS — K219 Gastro-esophageal reflux disease without esophagitis: Secondary | ICD-10-CM | POA: Diagnosis not present

## 2022-07-12 DIAGNOSIS — I10 Essential (primary) hypertension: Secondary | ICD-10-CM | POA: Diagnosis not present

## 2022-07-12 DIAGNOSIS — E1169 Type 2 diabetes mellitus with other specified complication: Secondary | ICD-10-CM | POA: Diagnosis not present

## 2022-07-14 DIAGNOSIS — F419 Anxiety disorder, unspecified: Secondary | ICD-10-CM | POA: Diagnosis not present

## 2022-07-14 DIAGNOSIS — F902 Attention-deficit hyperactivity disorder, combined type: Secondary | ICD-10-CM | POA: Diagnosis not present

## 2022-07-14 DIAGNOSIS — F3175 Bipolar disorder, in partial remission, most recent episode depressed: Secondary | ICD-10-CM | POA: Diagnosis not present

## 2022-07-14 DIAGNOSIS — F840 Autistic disorder: Secondary | ICD-10-CM | POA: Diagnosis not present

## 2022-07-22 DIAGNOSIS — F840 Autistic disorder: Secondary | ICD-10-CM | POA: Diagnosis not present

## 2022-07-22 DIAGNOSIS — F902 Attention-deficit hyperactivity disorder, combined type: Secondary | ICD-10-CM | POA: Diagnosis not present

## 2022-07-22 DIAGNOSIS — F419 Anxiety disorder, unspecified: Secondary | ICD-10-CM | POA: Diagnosis not present

## 2022-07-22 DIAGNOSIS — F3175 Bipolar disorder, in partial remission, most recent episode depressed: Secondary | ICD-10-CM | POA: Diagnosis not present

## 2022-07-28 DIAGNOSIS — F902 Attention-deficit hyperactivity disorder, combined type: Secondary | ICD-10-CM | POA: Diagnosis not present

## 2022-07-28 DIAGNOSIS — F840 Autistic disorder: Secondary | ICD-10-CM | POA: Diagnosis not present

## 2022-07-28 DIAGNOSIS — F3175 Bipolar disorder, in partial remission, most recent episode depressed: Secondary | ICD-10-CM | POA: Diagnosis not present

## 2022-07-28 DIAGNOSIS — F419 Anxiety disorder, unspecified: Secondary | ICD-10-CM | POA: Diagnosis not present

## 2022-07-28 DIAGNOSIS — R1084 Generalized abdominal pain: Secondary | ICD-10-CM | POA: Diagnosis not present

## 2022-08-03 DIAGNOSIS — R112 Nausea with vomiting, unspecified: Secondary | ICD-10-CM | POA: Diagnosis not present

## 2022-08-03 DIAGNOSIS — K7689 Other specified diseases of liver: Secondary | ICD-10-CM | POA: Diagnosis not present

## 2022-08-04 DIAGNOSIS — F3175 Bipolar disorder, in partial remission, most recent episode depressed: Secondary | ICD-10-CM | POA: Diagnosis not present

## 2022-08-04 DIAGNOSIS — F840 Autistic disorder: Secondary | ICD-10-CM | POA: Diagnosis not present

## 2022-08-04 DIAGNOSIS — F419 Anxiety disorder, unspecified: Secondary | ICD-10-CM | POA: Diagnosis not present

## 2022-08-04 DIAGNOSIS — F902 Attention-deficit hyperactivity disorder, combined type: Secondary | ICD-10-CM | POA: Diagnosis not present

## 2022-08-08 DIAGNOSIS — F410 Panic disorder [episodic paroxysmal anxiety] without agoraphobia: Secondary | ICD-10-CM | POA: Diagnosis not present

## 2022-08-08 DIAGNOSIS — F419 Anxiety disorder, unspecified: Secondary | ICD-10-CM | POA: Diagnosis not present

## 2022-08-08 DIAGNOSIS — E221 Hyperprolactinemia: Secondary | ICD-10-CM | POA: Diagnosis not present

## 2022-08-08 DIAGNOSIS — G4700 Insomnia, unspecified: Secondary | ICD-10-CM | POA: Diagnosis not present

## 2022-08-11 DIAGNOSIS — F3175 Bipolar disorder, in partial remission, most recent episode depressed: Secondary | ICD-10-CM | POA: Diagnosis not present

## 2022-08-11 DIAGNOSIS — F902 Attention-deficit hyperactivity disorder, combined type: Secondary | ICD-10-CM | POA: Diagnosis not present

## 2022-08-11 DIAGNOSIS — F840 Autistic disorder: Secondary | ICD-10-CM | POA: Diagnosis not present

## 2022-08-11 DIAGNOSIS — F419 Anxiety disorder, unspecified: Secondary | ICD-10-CM | POA: Diagnosis not present

## 2022-08-15 DIAGNOSIS — F410 Panic disorder [episodic paroxysmal anxiety] without agoraphobia: Secondary | ICD-10-CM | POA: Diagnosis not present

## 2022-08-15 DIAGNOSIS — F419 Anxiety disorder, unspecified: Secondary | ICD-10-CM | POA: Diagnosis not present

## 2022-08-15 DIAGNOSIS — E221 Hyperprolactinemia: Secondary | ICD-10-CM | POA: Diagnosis not present

## 2022-08-15 DIAGNOSIS — G4700 Insomnia, unspecified: Secondary | ICD-10-CM | POA: Diagnosis not present

## 2022-08-18 DIAGNOSIS — F3175 Bipolar disorder, in partial remission, most recent episode depressed: Secondary | ICD-10-CM | POA: Diagnosis not present

## 2022-08-18 DIAGNOSIS — F419 Anxiety disorder, unspecified: Secondary | ICD-10-CM | POA: Diagnosis not present

## 2022-08-18 DIAGNOSIS — F840 Autistic disorder: Secondary | ICD-10-CM | POA: Diagnosis not present

## 2022-08-18 DIAGNOSIS — F902 Attention-deficit hyperactivity disorder, combined type: Secondary | ICD-10-CM | POA: Diagnosis not present

## 2022-08-25 DIAGNOSIS — F840 Autistic disorder: Secondary | ICD-10-CM | POA: Diagnosis not present

## 2022-08-25 DIAGNOSIS — F419 Anxiety disorder, unspecified: Secondary | ICD-10-CM | POA: Diagnosis not present

## 2022-08-25 DIAGNOSIS — F3175 Bipolar disorder, in partial remission, most recent episode depressed: Secondary | ICD-10-CM | POA: Diagnosis not present

## 2022-08-25 DIAGNOSIS — F902 Attention-deficit hyperactivity disorder, combined type: Secondary | ICD-10-CM | POA: Diagnosis not present

## 2022-08-26 DIAGNOSIS — R112 Nausea with vomiting, unspecified: Secondary | ICD-10-CM | POA: Diagnosis not present

## 2022-08-26 DIAGNOSIS — R109 Unspecified abdominal pain: Secondary | ICD-10-CM | POA: Diagnosis not present

## 2022-08-26 DIAGNOSIS — K921 Melena: Secondary | ICD-10-CM | POA: Diagnosis not present

## 2022-08-26 DIAGNOSIS — R197 Diarrhea, unspecified: Secondary | ICD-10-CM | POA: Diagnosis not present

## 2022-09-01 DIAGNOSIS — F3175 Bipolar disorder, in partial remission, most recent episode depressed: Secondary | ICD-10-CM | POA: Diagnosis not present

## 2022-09-01 DIAGNOSIS — F902 Attention-deficit hyperactivity disorder, combined type: Secondary | ICD-10-CM | POA: Diagnosis not present

## 2022-09-01 DIAGNOSIS — F840 Autistic disorder: Secondary | ICD-10-CM | POA: Diagnosis not present

## 2022-09-01 DIAGNOSIS — F419 Anxiety disorder, unspecified: Secondary | ICD-10-CM | POA: Diagnosis not present

## 2022-09-12 DIAGNOSIS — F419 Anxiety disorder, unspecified: Secondary | ICD-10-CM | POA: Diagnosis not present

## 2022-09-12 DIAGNOSIS — F410 Panic disorder [episodic paroxysmal anxiety] without agoraphobia: Secondary | ICD-10-CM | POA: Diagnosis not present

## 2022-09-12 DIAGNOSIS — G4700 Insomnia, unspecified: Secondary | ICD-10-CM | POA: Diagnosis not present

## 2022-09-12 DIAGNOSIS — E221 Hyperprolactinemia: Secondary | ICD-10-CM | POA: Diagnosis not present

## 2022-10-05 DIAGNOSIS — K7689 Other specified diseases of liver: Secondary | ICD-10-CM | POA: Diagnosis not present

## 2022-10-05 DIAGNOSIS — K760 Fatty (change of) liver, not elsewhere classified: Secondary | ICD-10-CM | POA: Diagnosis not present

## 2022-10-05 DIAGNOSIS — I10 Essential (primary) hypertension: Secondary | ICD-10-CM | POA: Diagnosis not present

## 2022-10-05 DIAGNOSIS — R110 Nausea: Secondary | ICD-10-CM | POA: Diagnosis not present

## 2022-10-05 DIAGNOSIS — E1165 Type 2 diabetes mellitus with hyperglycemia: Secondary | ICD-10-CM | POA: Diagnosis not present

## 2022-10-10 DIAGNOSIS — E221 Hyperprolactinemia: Secondary | ICD-10-CM | POA: Diagnosis not present

## 2022-10-10 DIAGNOSIS — F410 Panic disorder [episodic paroxysmal anxiety] without agoraphobia: Secondary | ICD-10-CM | POA: Diagnosis not present

## 2022-10-10 DIAGNOSIS — F419 Anxiety disorder, unspecified: Secondary | ICD-10-CM | POA: Diagnosis not present

## 2022-10-10 DIAGNOSIS — G4700 Insomnia, unspecified: Secondary | ICD-10-CM | POA: Diagnosis not present

## 2022-10-13 DIAGNOSIS — F840 Autistic disorder: Secondary | ICD-10-CM | POA: Diagnosis not present

## 2022-10-13 DIAGNOSIS — F3112 Bipolar disorder, current episode manic without psychotic features, moderate: Secondary | ICD-10-CM | POA: Diagnosis not present

## 2022-10-13 DIAGNOSIS — F4310 Post-traumatic stress disorder, unspecified: Secondary | ICD-10-CM | POA: Diagnosis not present

## 2022-10-13 DIAGNOSIS — F902 Attention-deficit hyperactivity disorder, combined type: Secondary | ICD-10-CM | POA: Diagnosis not present

## 2022-10-20 DIAGNOSIS — F902 Attention-deficit hyperactivity disorder, combined type: Secondary | ICD-10-CM | POA: Diagnosis not present

## 2022-10-20 DIAGNOSIS — F4310 Post-traumatic stress disorder, unspecified: Secondary | ICD-10-CM | POA: Diagnosis not present

## 2022-10-20 DIAGNOSIS — F3112 Bipolar disorder, current episode manic without psychotic features, moderate: Secondary | ICD-10-CM | POA: Diagnosis not present

## 2022-10-20 DIAGNOSIS — F840 Autistic disorder: Secondary | ICD-10-CM | POA: Diagnosis not present

## 2022-10-25 DIAGNOSIS — F3112 Bipolar disorder, current episode manic without psychotic features, moderate: Secondary | ICD-10-CM | POA: Diagnosis not present

## 2022-10-25 DIAGNOSIS — F840 Autistic disorder: Secondary | ICD-10-CM | POA: Diagnosis not present

## 2022-10-25 DIAGNOSIS — F4310 Post-traumatic stress disorder, unspecified: Secondary | ICD-10-CM | POA: Diagnosis not present

## 2022-10-25 DIAGNOSIS — F902 Attention-deficit hyperactivity disorder, combined type: Secondary | ICD-10-CM | POA: Diagnosis not present

## 2022-10-26 DIAGNOSIS — E221 Hyperprolactinemia: Secondary | ICD-10-CM | POA: Diagnosis not present

## 2022-10-26 DIAGNOSIS — F410 Panic disorder [episodic paroxysmal anxiety] without agoraphobia: Secondary | ICD-10-CM | POA: Diagnosis not present

## 2022-10-26 DIAGNOSIS — G4700 Insomnia, unspecified: Secondary | ICD-10-CM | POA: Diagnosis not present

## 2022-10-26 DIAGNOSIS — F419 Anxiety disorder, unspecified: Secondary | ICD-10-CM | POA: Diagnosis not present

## 2022-10-27 DIAGNOSIS — F902 Attention-deficit hyperactivity disorder, combined type: Secondary | ICD-10-CM | POA: Diagnosis not present

## 2022-10-27 DIAGNOSIS — F3112 Bipolar disorder, current episode manic without psychotic features, moderate: Secondary | ICD-10-CM | POA: Diagnosis not present

## 2022-10-27 DIAGNOSIS — F840 Autistic disorder: Secondary | ICD-10-CM | POA: Diagnosis not present

## 2022-10-27 DIAGNOSIS — F4310 Post-traumatic stress disorder, unspecified: Secondary | ICD-10-CM | POA: Diagnosis not present

## 2022-11-03 DIAGNOSIS — F3112 Bipolar disorder, current episode manic without psychotic features, moderate: Secondary | ICD-10-CM | POA: Diagnosis not present

## 2022-11-03 DIAGNOSIS — F4310 Post-traumatic stress disorder, unspecified: Secondary | ICD-10-CM | POA: Diagnosis not present

## 2022-11-03 DIAGNOSIS — F840 Autistic disorder: Secondary | ICD-10-CM | POA: Diagnosis not present

## 2022-11-03 DIAGNOSIS — F902 Attention-deficit hyperactivity disorder, combined type: Secondary | ICD-10-CM | POA: Diagnosis not present

## 2022-11-07 DIAGNOSIS — F419 Anxiety disorder, unspecified: Secondary | ICD-10-CM | POA: Diagnosis not present

## 2022-11-07 DIAGNOSIS — E221 Hyperprolactinemia: Secondary | ICD-10-CM | POA: Diagnosis not present

## 2022-11-07 DIAGNOSIS — G4700 Insomnia, unspecified: Secondary | ICD-10-CM | POA: Diagnosis not present

## 2022-11-07 DIAGNOSIS — F1220 Cannabis dependence, uncomplicated: Secondary | ICD-10-CM | POA: Diagnosis not present

## 2022-11-10 DIAGNOSIS — F902 Attention-deficit hyperactivity disorder, combined type: Secondary | ICD-10-CM | POA: Diagnosis not present

## 2022-11-10 DIAGNOSIS — F4310 Post-traumatic stress disorder, unspecified: Secondary | ICD-10-CM | POA: Diagnosis not present

## 2022-11-10 DIAGNOSIS — F840 Autistic disorder: Secondary | ICD-10-CM | POA: Diagnosis not present

## 2022-11-10 DIAGNOSIS — F3112 Bipolar disorder, current episode manic without psychotic features, moderate: Secondary | ICD-10-CM | POA: Diagnosis not present

## 2022-11-17 ENCOUNTER — Encounter (HOSPITAL_BASED_OUTPATIENT_CLINIC_OR_DEPARTMENT_OTHER): Payer: Self-pay | Admitting: Emergency Medicine

## 2022-11-17 ENCOUNTER — Inpatient Hospital Stay (HOSPITAL_BASED_OUTPATIENT_CLINIC_OR_DEPARTMENT_OTHER)
Admission: EM | Admit: 2022-11-17 | Discharge: 2022-11-22 | DRG: 639 | Disposition: A | Discharge status: Home / Self Care | Payer: BC Managed Care – PPO | Attending: Internal Medicine | Admitting: Internal Medicine

## 2022-11-17 ENCOUNTER — Other Ambulatory Visit: Payer: Self-pay

## 2022-11-17 DIAGNOSIS — Z7985 Long-term (current) use of injectable non-insulin antidiabetic drugs: Secondary | ICD-10-CM | POA: Diagnosis not present

## 2022-11-17 DIAGNOSIS — F17210 Nicotine dependence, cigarettes, uncomplicated: Secondary | ICD-10-CM | POA: Diagnosis present

## 2022-11-17 DIAGNOSIS — K3184 Gastroparesis: Secondary | ICD-10-CM | POA: Diagnosis not present

## 2022-11-17 DIAGNOSIS — F419 Anxiety disorder, unspecified: Secondary | ICD-10-CM | POA: Diagnosis present

## 2022-11-17 DIAGNOSIS — F3112 Bipolar disorder, current episode manic without psychotic features, moderate: Secondary | ICD-10-CM | POA: Diagnosis not present

## 2022-11-17 DIAGNOSIS — F4310 Post-traumatic stress disorder, unspecified: Secondary | ICD-10-CM | POA: Diagnosis not present

## 2022-11-17 DIAGNOSIS — E876 Hypokalemia: Secondary | ICD-10-CM | POA: Diagnosis not present

## 2022-11-17 DIAGNOSIS — R112 Nausea with vomiting, unspecified: Secondary | ICD-10-CM | POA: Diagnosis not present

## 2022-11-17 DIAGNOSIS — R1110 Vomiting, unspecified: Secondary | ICD-10-CM | POA: Diagnosis not present

## 2022-11-17 DIAGNOSIS — F909 Attention-deficit hyperactivity disorder, unspecified type: Secondary | ICD-10-CM | POA: Diagnosis not present

## 2022-11-17 DIAGNOSIS — F1210 Cannabis abuse, uncomplicated: Secondary | ICD-10-CM | POA: Diagnosis not present

## 2022-11-17 DIAGNOSIS — F1299 Cannabis use, unspecified with unspecified cannabis-induced disorder: Secondary | ICD-10-CM | POA: Diagnosis present

## 2022-11-17 DIAGNOSIS — R6883 Chills (without fever): Secondary | ICD-10-CM | POA: Diagnosis not present

## 2022-11-17 DIAGNOSIS — K5900 Constipation, unspecified: Secondary | ICD-10-CM | POA: Diagnosis present

## 2022-11-17 DIAGNOSIS — Z803 Family history of malignant neoplasm of breast: Secondary | ICD-10-CM

## 2022-11-17 DIAGNOSIS — R739 Hyperglycemia, unspecified: Secondary | ICD-10-CM

## 2022-11-17 DIAGNOSIS — Z79899 Other long term (current) drug therapy: Secondary | ICD-10-CM | POA: Diagnosis not present

## 2022-11-17 DIAGNOSIS — Z880 Allergy status to penicillin: Secondary | ICD-10-CM

## 2022-11-17 DIAGNOSIS — E1143 Type 2 diabetes mellitus with diabetic autonomic (poly)neuropathy: Secondary | ICD-10-CM | POA: Diagnosis present

## 2022-11-17 DIAGNOSIS — F1290 Cannabis use, unspecified, uncomplicated: Secondary | ICD-10-CM | POA: Diagnosis present

## 2022-11-17 DIAGNOSIS — F12188 Cannabis abuse with other cannabis-induced disorder: Secondary | ICD-10-CM | POA: Diagnosis not present

## 2022-11-17 DIAGNOSIS — R4182 Altered mental status, unspecified: Secondary | ICD-10-CM | POA: Diagnosis not present

## 2022-11-17 DIAGNOSIS — E861 Hypovolemia: Secondary | ICD-10-CM | POA: Diagnosis not present

## 2022-11-17 DIAGNOSIS — I10 Essential (primary) hypertension: Secondary | ICD-10-CM | POA: Diagnosis not present

## 2022-11-17 DIAGNOSIS — F840 Autistic disorder: Secondary | ICD-10-CM | POA: Diagnosis not present

## 2022-11-17 DIAGNOSIS — F902 Attention-deficit hyperactivity disorder, combined type: Secondary | ICD-10-CM | POA: Diagnosis not present

## 2022-11-17 DIAGNOSIS — E1010 Type 1 diabetes mellitus with ketoacidosis without coma: Secondary | ICD-10-CM | POA: Diagnosis not present

## 2022-11-17 DIAGNOSIS — F319 Bipolar disorder, unspecified: Secondary | ICD-10-CM | POA: Diagnosis not present

## 2022-11-17 DIAGNOSIS — K760 Fatty (change of) liver, not elsewhere classified: Secondary | ICD-10-CM | POA: Diagnosis present

## 2022-11-17 DIAGNOSIS — E1110 Type 2 diabetes mellitus with ketoacidosis without coma: Secondary | ICD-10-CM | POA: Diagnosis not present

## 2022-11-17 LAB — URINALYSIS, ROUTINE W REFLEX MICROSCOPIC
Bacteria, UA: NONE SEEN
Bilirubin Urine: NEGATIVE
Glucose, UA: >1000 mg/dL — AB
Hgb urine dipstick: NEGATIVE
Ketones, ur: >80 mg/dL — AB
Leukocytes,Ua: NEGATIVE
Nitrite: NEGATIVE
Protein, ur: NEGATIVE mg/dL
Specific Gravity, Urine: 1.024 (ref 1.005–1.030)
pH: 5.5 (ref 5.0–8.0)

## 2022-11-17 LAB — RAPID URINE DRUG SCREEN, HOSP PERFORMED
Amphetamines: NOT DETECTED
Barbiturates: NOT DETECTED
Benzodiazepines: NOT DETECTED
Cocaine: NOT DETECTED
Opiates: NOT DETECTED
Tetrahydrocannabinol: POSITIVE — AB

## 2022-11-17 LAB — I-STAT VENOUS BLOOD GAS, ED
Acid-base deficit: 4 mmol/L — ABNORMAL HIGH (ref 0.0–2.0)
Bicarbonate: 21 mmol/L (ref 20.0–28.0)
Calcium, Ion: 1.21 mmol/L (ref 1.15–1.40)
HCT: 43 % (ref 36.0–46.0)
Hemoglobin: 14.6 g/dL (ref 12.0–15.0)
O2 Saturation: 54 %
Patient temperature: 98.1
Potassium: 3.8 mmol/L (ref 3.5–5.1)
Sodium: 137 mmol/L (ref 135–145)
TCO2: 22 mmol/L (ref 22–32)
pCO2, Ven: 39.1 mmHg — ABNORMAL LOW (ref 44–60)
pH, Ven: 7.337 (ref 7.25–7.43)
pO2, Ven: 30 mmHg — CL (ref 32–45)

## 2022-11-17 LAB — CBC
HCT: 44.5 % (ref 36.0–46.0)
Hemoglobin: 15.2 g/dL — ABNORMAL HIGH (ref 12.0–15.0)
MCH: 29.7 pg (ref 26.0–34.0)
MCHC: 34.2 g/dL (ref 30.0–36.0)
MCV: 87.1 fL (ref 80.0–100.0)
Platelets: 221 10*3/uL (ref 150–400)
RBC: 5.11 MIL/uL (ref 3.87–5.11)
RDW: 12.4 % (ref 11.5–15.5)
WBC: 9.1 10*3/uL (ref 4.0–10.5)
nRBC: 0 % (ref 0.0–0.2)

## 2022-11-17 LAB — CBG MONITORING, ED
Glucose-Capillary: 200 mg/dL — ABNORMAL HIGH (ref 70–99)
Glucose-Capillary: 199 mg/dL — ABNORMAL HIGH (ref 70–99)

## 2022-11-17 LAB — LIPASE, BLOOD: Lipase: 32 U/L (ref 11–51)

## 2022-11-17 LAB — COMPREHENSIVE METABOLIC PANEL
ALT: 31 U/L (ref 0–44)
AST: 29 U/L (ref 15–41)
Albumin: 5.3 g/dL — ABNORMAL HIGH (ref 3.5–5.0)
Alkaline Phosphatase: 60 U/L (ref 38–126)
Anion gap: 18 — ABNORMAL HIGH (ref 5–15)
BUN: 11 mg/dL (ref 6–20)
CO2: 21 mmol/L — ABNORMAL LOW (ref 22–32)
Calcium: 9.9 mg/dL (ref 8.9–10.3)
Chloride: 98 mmol/L (ref 98–111)
Creatinine, Ser: 0.98 mg/dL (ref 0.44–1.00)
GFR, Estimated: >60 mL/min (ref >60)
Glucose, Bld: 263 mg/dL — ABNORMAL HIGH (ref 70–99)
Potassium: 4 mmol/L (ref 3.5–5.1)
Sodium: 137 mmol/L (ref 135–145)
Total Bilirubin: 0.7 mg/dL (ref 0.3–1.2)
Total Protein: 8 g/dL (ref 6.5–8.1)

## 2022-11-17 LAB — HCG, SERUM, QUALITATIVE: Preg, Serum: NEGATIVE

## 2022-11-17 LAB — BETA-HYDROXYBUTYRIC ACID: Beta-Hydroxybutyric Acid: 2.54 mmol/L — ABNORMAL HIGH (ref 0.05–0.27)

## 2022-11-17 MED ORDER — ONDANSETRON HCL 4 MG/2ML IJ SOLN
4.0000 mg | Freq: Once | INTRAMUSCULAR | Status: AC
  Administered 2022-11-18: 4 mg via INTRAVENOUS
  Filled 2022-11-17: qty 2

## 2022-11-17 MED ORDER — HALOPERIDOL LACTATE 5 MG/ML IJ SOLN
2.0000 mg | Freq: Once | INTRAMUSCULAR | Status: AC
  Administered 2022-11-17: 2 mg via INTRAVENOUS
  Filled 2022-11-17: qty 1

## 2022-11-17 MED ORDER — SODIUM CHLORIDE 0.9 % IV SOLN
25.0000 mg | Freq: Four times a day (QID) | INTRAVENOUS | Status: DC | PRN
  Administered 2022-11-18: 25 mg via INTRAVENOUS
  Filled 2022-11-17: qty 1
  Filled 2022-11-17: qty 25

## 2022-11-17 MED ORDER — SODIUM CHLORIDE 0.9 % IV BOLUS
1000.0000 mL | Freq: Once | INTRAVENOUS | Status: AC
  Administered 2022-11-17: 1000 mL via INTRAVENOUS

## 2022-11-17 MED ORDER — PROCHLORPERAZINE EDISYLATE 10 MG/2ML IJ SOLN
10.0000 mg | Freq: Once | INTRAMUSCULAR | Status: AC
  Administered 2022-11-18: 10 mg via INTRAVENOUS
  Filled 2022-11-17: qty 2

## 2022-11-17 MED ORDER — DIAZEPAM 5 MG/ML IJ SOLN
2.5000 mg | Freq: Once | INTRAMUSCULAR | Status: AC
  Administered 2022-11-17: 2.5 mg via INTRAVENOUS
  Filled 2022-11-17: qty 2

## 2022-11-17 MED ORDER — PROMETHAZINE HCL 25 MG/ML IJ SOLN
INTRAMUSCULAR | Status: AC
  Administered 2022-11-17: 25 mg
  Filled 2022-11-17: qty 1

## 2022-11-17 NOTE — ED Notes (Signed)
Bed lowered for pt, denies other needs at this time. Updated about pending labs

## 2022-11-17 NOTE — ED Triage Notes (Signed)
Reports nausea x 7 years but got really bad today. Vomiting today. Home zofran doesn't help. Seen at UC reports high BP  and blood sugar over 200

## 2022-11-17 NOTE — ED Notes (Signed)
Pt sts that nausea meds are starting to help, requested water, informed about waiting for nausea meds to infuse, pt insisted, warm blanket provided. Denies other needs, family member sts no needs.

## 2022-11-17 NOTE — ED Provider Notes (Signed)
[place] EMERGENCY DEPARTMENT AT [facility] Provider Note   CSN: [id number] Arrival date & time: 11/17/22  1752     History  Chief Complaint  Patient presents with   Emesis    Alyssa Sanford is a 27 y.o. female.  Patient presents to the emergency department complaining of intractable nausea and vomiting.  Patient states she has had frequent episodes of nausea and vomiting over the past 7 years but that that her symptoms worsened significantly today.  She states she had her home dose of Zofran with no relief.  She was then evaluated in urgent care who reported a high blood pressure and a blood sugar of 280.  They recommend she come to the emergency department for further evaluation.  Patient is hypertensive upon arrival.  She denies abdominal pain, chest pain, shortness of breath.  She endorses nausea and vomiting which has not been relieved by Zofran.  The patient states that she is a type II diabetic and takes Ozempic.  She states that she had her last dose of Ozempic over the weekend but that has been left out of the refrigerator and may have "gone bad".  She does not believe it has worked as it is supposed to this time.  She takes injections weekly.  Past medical history otherwise significant for ADHD, depression anxiety, bipolar 1 disorder, hypertension  HPI     Home Medications Prior to Admission medications   Medication Sig Start Date End Date Taking? Authorizing Provider  amLODipine (NORVASC) 10 MG tablet Take 10 mg by mouth daily.    [provider]  amoxicillin (AMOXIL) 875 MG tablet Take 875 mg by mouth 2 (two) times daily. 07/17/14   [provider]  amphetamine-dextroamphetamine (ADDERALL XR) 25 MG 24 hr capsule Take 25 mg by mouth every morning.    [provider]  atenolol (TENORMIN) 100 MG tablet Take 100 mg by mouth daily.    [provider]  BIOTIN PO Take 2 tablets by mouth daily.    [provider]  clonazePAM  (KLONOPIN) 0.5 MG tablet Take 0.5 mg by mouth 2 (two) times daily as needed for anxiety.    [provider]  diphenhydrAMINE (BENADRYL) 25 MG tablet Take 1 tablet (25 mg total) by mouth every 8 (eight) hours as needed. 07/26/14   Sciacca, Marissa, PA-C  diphenhydrAMINE (SOMINEX) 25 MG tablet Take 50 mg by mouth at bedtime as needed for itching, allergies or sleep.    [provider]  etonogestrel (NEXPLANON) 68 MG IMPL implant 1 each by Subdermal route once.    [provider]  LITHIUM PO Take by mouth.    [provider]  METFORMIN HCL PO Take by mouth.    [provider]  norethindrone-ethinyl estradiol-iron (MICROGESTIN FE,GILDESS FE,LOESTRIN FE) 1.5-30 MG-MCG tablet Take 1 tablet by mouth daily.    [provider]  PARoxetine (PAXIL) 40 MG tablet Take 40 mg by mouth every morning.    [provider]  Phenylephrine-DM-GG (TUSSIN CF PO) Take 1 tablet by mouth daily.    [provider]  pseudoephedrine (SUDAFED) 120 MG 12 hr tablet Take 120 mg by mouth 2 (two) times daily.    [provider]  QUEtiapine (SEROQUEL) 25 MG tablet Take 25 mg by mouth at bedtime.    [provider]  risperiDONE (RISPERDAL) 1 MG tablet Take 1 mg by mouth at bedtime.    [provider]  risperidone (RISPERDAL) 4 MG tablet Take 4  mg by mouth 2 (two) times daily.    [provider]      Allergies    Amoxicillin and Penicillins    Review of Systems   Review of Systems  Physical Exam Updated Vital Signs BP (!) 154/119   Pulse 94   Temp 98 F (36.7 C)   Resp 17   SpO2 100%  Physical Exam Vitals and nursing note reviewed.  Constitutional:      General: She is in acute distress.     Appearance: She is well-developed.     Comments: Patient actively vomiting upon arrival  HENT:     Head: Normocephalic and atraumatic.  Eyes:     Conjunctiva/sclera: Conjunctivae normal.  Cardiovascular:     Rate and  Rhythm: Normal rate and regular rhythm.     Heart sounds: No murmur heard. Pulmonary:     Effort: Pulmonary effort is normal. No respiratory distress.     Breath sounds: Normal breath sounds.  Abdominal:     Palpations: Abdomen is soft.     Tenderness: There is no abdominal tenderness.     Comments: No abdominal tenderness, no distention noted  Musculoskeletal:        General: No swelling.     Cervical back: Neck supple.  Skin:    General: Skin is warm and dry.     Capillary Refill: Capillary refill takes less than 2 seconds.  Neurological:     Mental Status: She is alert.  Psychiatric:        Mood and Affect: Mood normal.     ED Results / Procedures / Treatments   Labs (all labs ordered are listed, but only abnormal results are displayed) Labs Reviewed  COMPREHENSIVE METABOLIC PANEL - Abnormal; Notable for the following components:      Result Value   CO2 21 (*)    Glucose, Bld 263 (*)    Albumin 5.3 (*)    Anion gap 18 (*)    All other components within normal limits  CBC - Abnormal; Notable for the following components:   Hemoglobin 15.2 (*)    All other components within normal limits  BETA-HYDROXYBUTYRIC ACID - Abnormal; Notable for the following components:   Beta-Hydroxybutyric Acid 2.54 (*)    All other components within normal limits  URINALYSIS, ROUTINE W REFLEX MICROSCOPIC - Abnormal; Notable for the following components:   Glucose, UA >1,000 (*)    Ketones, ur >80 (*)    All other components within normal limits  RAPID URINE DRUG SCREEN, HOSP PERFORMED - Abnormal; Notable for the following components:   Tetrahydrocannabinol POSITIVE (*)    All other components within normal limits  CBG MONITORING, ED - Abnormal; Notable for the following components:   Glucose-Capillary 200 (*)    All other components within normal limits  I-STAT VENOUS BLOOD GAS, ED - Abnormal; Notable for the following components:   pCO2, Ven 39.1 (*)    pO2, Ven 30 (*)    Acid-base  deficit 4.0 (*)    All other components within normal limits  CBG MONITORING, ED - Abnormal; Notable for the following components:   Glucose-Capillary 199 (*)    All other components within normal limits  LIPASE, BLOOD  HCG, SERUM, QUALITATIVE    EKG None  Radiology No results found.  Procedures Procedures    Medications Ordered in ED Medications  promethazine (PHENERGAN) 25 mg in sodium chloride 0.9 % 50 mL IVPB (has no administration in time range)  sodium chloride  0.9 % bolus 1,000 mL (0 mLs Intravenous Stopped 11/17/22 2000)  promethazine (PHENERGAN) 25 MG/ML injection (25 mg  Given 11/17/22 1926)  diazepam (VALIUM) injection 2.5 mg (2.5 mg Intravenous Given 11/17/22 2019)  sodium chloride 0.9 % bolus 1,000 mL (1,000 mLs Intravenous New Bag/Given 11/17/22 2147)  haloperidol lactate (HALDOL) injection 2 mg (2 mg Intravenous Given 11/17/22 2148)    ED Course/ Medical Decision Making/ A&P                             Medical Decision Making Amount and/or Complexity of Data Reviewed Labs: ordered.  Risk Prescription drug management. Decision regarding hospitalization.   This patient presents to the ED for concern of nausea and vomiting, this involves an extensive number of treatment options, and is a complaint that carries with it a high risk of complications and morbidity.  The differential diagnosis includes cyclical vomiting syndrome, cannabinoid hyperemesis syndrome, DKA, intra-abdominal abnormality, others   Co morbidities that complicate the patient evaluation  Type 2 diabetes, chronic nausea and vomiting   Additional history obtained:  Additional history obtained from his father at bedside   Lab Tests:  I Ordered, and personally interpreted labs.  The pertinent results include: Glucose on CMP 263, negative pregnancy test, anion gap 18, UDS positive for THC, beta hydroxybutyric acid 2.54, UA with greater than 1000 glucose, greater than 80 ketones   Imaging  Studies ordered:  The patient has no abdominal tenderness on examination.  No signs of acute/surgical abdomen.  No indication for abdominal imaging at this time   Consultations Obtained:  I requested consultation with the hospitalist, Dr.Howerter and discussed lab and imaging findings as well as pertinent plan - they recommend: admission   Problem List / ED Course / Critical interventions / Medication management   I ordered medication including Phenergan and Valium for nausea and vomiting, normal saline for fluid resuscitation, elevated glucose, Haldol for continued nausea and vomiting Reevaluation of the patient after these medicines showed that the patient stayed the same I have reviewed the patients home medicines and have made adjustments as needed    Test / Admission - Considered:  Patient continues to have intractable nausea and vomiting.  Patient with chronic marijuana usage.  Symptoms appear consistent with cannabinoid hyperemesis syndrome.  Patient does have a elevated anion gap and elevated beta hydroxybutyric acid.  Mildly elevated glucose levels.  Potentially very mild DKA but presentation seems more consistent with cannabinoid hyperemesis at this time.  Patient needs admission to the hospital for further management of her intractable nausea and vomiting.         Final Clinical Impression(s) / ED Diagnoses Final diagnoses:  Cannabinoid hyperemesis syndrome  Blood glucose elevated    Rx / DC Orders ED Discharge Orders     None         Pamala Duffel 11/17/22 2228    Cathren Laine, MD 11/18/22 1504

## 2022-11-18 DIAGNOSIS — R112 Nausea with vomiting, unspecified: Secondary | ICD-10-CM | POA: Diagnosis not present

## 2022-11-18 DIAGNOSIS — Z79899 Other long term (current) drug therapy: Secondary | ICD-10-CM | POA: Diagnosis not present

## 2022-11-18 DIAGNOSIS — K760 Fatty (change of) liver, not elsewhere classified: Secondary | ICD-10-CM | POA: Diagnosis present

## 2022-11-18 DIAGNOSIS — F1290 Cannabis use, unspecified, uncomplicated: Secondary | ICD-10-CM | POA: Diagnosis present

## 2022-11-18 DIAGNOSIS — E876 Hypokalemia: Secondary | ICD-10-CM | POA: Diagnosis present

## 2022-11-18 DIAGNOSIS — Z803 Family history of malignant neoplasm of breast: Secondary | ICD-10-CM | POA: Diagnosis not present

## 2022-11-18 DIAGNOSIS — E861 Hypovolemia: Secondary | ICD-10-CM | POA: Diagnosis present

## 2022-11-18 DIAGNOSIS — E1143 Type 2 diabetes mellitus with diabetic autonomic (poly)neuropathy: Secondary | ICD-10-CM | POA: Diagnosis present

## 2022-11-18 DIAGNOSIS — F1299 Cannabis use, unspecified with unspecified cannabis-induced disorder: Secondary | ICD-10-CM | POA: Diagnosis present

## 2022-11-18 DIAGNOSIS — I10 Essential (primary) hypertension: Secondary | ICD-10-CM | POA: Diagnosis not present

## 2022-11-18 DIAGNOSIS — Z7985 Long-term (current) use of injectable non-insulin antidiabetic drugs: Secondary | ICD-10-CM | POA: Diagnosis not present

## 2022-11-18 DIAGNOSIS — E1110 Type 2 diabetes mellitus with ketoacidosis without coma: Secondary | ICD-10-CM

## 2022-11-18 DIAGNOSIS — F17210 Nicotine dependence, cigarettes, uncomplicated: Secondary | ICD-10-CM | POA: Diagnosis present

## 2022-11-18 DIAGNOSIS — Z880 Allergy status to penicillin: Secondary | ICD-10-CM | POA: Diagnosis not present

## 2022-11-18 DIAGNOSIS — K3184 Gastroparesis: Secondary | ICD-10-CM | POA: Diagnosis present

## 2022-11-18 DIAGNOSIS — F419 Anxiety disorder, unspecified: Secondary | ICD-10-CM | POA: Diagnosis present

## 2022-11-18 DIAGNOSIS — F319 Bipolar disorder, unspecified: Secondary | ICD-10-CM

## 2022-11-18 DIAGNOSIS — K5900 Constipation, unspecified: Secondary | ICD-10-CM | POA: Diagnosis present

## 2022-11-18 DIAGNOSIS — F909 Attention-deficit hyperactivity disorder, unspecified type: Secondary | ICD-10-CM | POA: Diagnosis present

## 2022-11-18 LAB — CBC
HCT: 37.1 % (ref 36.0–46.0)
Hemoglobin: 12.9 g/dL (ref 12.0–15.0)
MCH: 30.4 pg (ref 26.0–34.0)
MCHC: 34.8 g/dL (ref 30.0–36.0)
MCV: 87.3 fL (ref 80.0–100.0)
Platelets: 206 10*3/uL (ref 150–400)
RBC: 4.25 MIL/uL (ref 3.87–5.11)
RDW: 12.4 % (ref 11.5–15.5)
WBC: 9.3 10*3/uL (ref 4.0–10.5)
nRBC: 0 % (ref 0.0–0.2)

## 2022-11-18 LAB — BASIC METABOLIC PANEL
Anion gap: 14 (ref 5–15)
Anion gap: 11 (ref 5–15)
Anion gap: 12 (ref 5–15)
Anion gap: 11 (ref 5–15)
BUN: 7 mg/dL (ref 6–20)
BUN: 6 mg/dL (ref 6–20)
BUN: 6 mg/dL (ref 6–20)
BUN: <5 mg/dL — ABNORMAL LOW (ref 6–20)
CO2: 17 mmol/L — ABNORMAL LOW (ref 22–32)
CO2: 18 mmol/L — ABNORMAL LOW (ref 22–32)
CO2: 19 mmol/L — ABNORMAL LOW (ref 22–32)
CO2: 19 mmol/L — ABNORMAL LOW (ref 22–32)
Calcium: 7.8 mg/dL — ABNORMAL LOW (ref 8.9–10.3)
Calcium: 7.8 mg/dL — ABNORMAL LOW (ref 8.9–10.3)
Calcium: 8.2 mg/dL — ABNORMAL LOW (ref 8.9–10.3)
Calcium: 8.2 mg/dL — ABNORMAL LOW (ref 8.9–10.3)
Chloride: 106 mmol/L (ref 98–111)
Chloride: 105 mmol/L (ref 98–111)
Chloride: 104 mmol/L (ref 98–111)
Chloride: 104 mmol/L (ref 98–111)
Creatinine, Ser: 0.8 mg/dL (ref 0.44–1.00)
Creatinine, Ser: 0.74 mg/dL (ref 0.44–1.00)
Creatinine, Ser: 0.68 mg/dL (ref 0.44–1.00)
Creatinine, Ser: 0.81 mg/dL (ref 0.44–1.00)
GFR, Estimated: >60 mL/min (ref >60)
GFR, Estimated: >60 mL/min (ref >60)
GFR, Estimated: >60 mL/min (ref >60)
GFR, Estimated: >60 mL/min (ref >60)
Glucose, Bld: 212 mg/dL — ABNORMAL HIGH (ref 70–99)
Glucose, Bld: 204 mg/dL — ABNORMAL HIGH (ref 70–99)
Glucose, Bld: 157 mg/dL — ABNORMAL HIGH (ref 70–99)
Glucose, Bld: 151 mg/dL — ABNORMAL HIGH (ref 70–99)
Potassium: 3.2 mmol/L — ABNORMAL LOW (ref 3.5–5.1)
Potassium: 3.2 mmol/L — ABNORMAL LOW (ref 3.5–5.1)
Potassium: 3.1 mmol/L — ABNORMAL LOW (ref 3.5–5.1)
Potassium: 3 mmol/L — ABNORMAL LOW (ref 3.5–5.1)
Sodium: 137 mmol/L (ref 135–145)
Sodium: 134 mmol/L — ABNORMAL LOW (ref 135–145)
Sodium: 135 mmol/L (ref 135–145)
Sodium: 134 mmol/L — ABNORMAL LOW (ref 135–145)

## 2022-11-18 LAB — GLUCOSE, CAPILLARY
Glucose-Capillary: 120 mg/dL — ABNORMAL HIGH (ref 70–99)
Glucose-Capillary: 123 mg/dL — ABNORMAL HIGH (ref 70–99)
Glucose-Capillary: 148 mg/dL — ABNORMAL HIGH (ref 70–99)
Glucose-Capillary: 158 mg/dL — ABNORMAL HIGH (ref 70–99)
Glucose-Capillary: 187 mg/dL — ABNORMAL HIGH (ref 70–99)
Glucose-Capillary: 197 mg/dL — ABNORMAL HIGH (ref 70–99)

## 2022-11-18 LAB — HEMOGLOBIN A1C
Hgb A1c MFr Bld: 9.3 % — ABNORMAL HIGH (ref 4.8–5.6)
Mean Plasma Glucose: 220.21 mg/dL

## 2022-11-18 LAB — HIV ANTIBODY (ROUTINE TESTING W REFLEX): HIV Screen 4th Generation wRfx: NONREACTIVE

## 2022-11-18 MED ORDER — SODIUM CHLORIDE 0.9 % IV SOLN: INTRAVENOUS | Status: DC

## 2022-11-18 MED ORDER — TRAZODONE HCL 50 MG PO TABS: 50.0000 mg | ORAL_TABLET | ORAL | Status: DC

## 2022-11-18 MED ORDER — AMPHETAMINE-DEXTROAMPHETAMINE 10 MG PO TABS: 10.0000 mg | ORAL_TABLET | ORAL | Status: DC

## 2022-11-18 MED ORDER — METOCLOPRAMIDE HCL 5 MG/ML IJ SOLN
5.0000 mg | Freq: Three times a day (TID) | INTRAMUSCULAR | Status: DC
  Administered 2022-11-18 – 2022-11-19 (×4): 5 mg via INTRAVENOUS
  Filled 2022-11-18 (×4): qty 2

## 2022-11-18 MED ORDER — AMPHETAMINE-DEXTROAMPHETAMINE 10 MG PO TABS
10.0000 mg | ORAL_TABLET | ORAL | Status: DC
  Administered 2022-11-20 – 2022-11-21 (×2): 10 mg via ORAL
  Filled 2022-11-18 (×2): qty 1

## 2022-11-18 MED ORDER — RISPERIDONE 1 MG PO TABS
4.0000 mg | ORAL_TABLET | Freq: Every day | ORAL | Status: DC
  Administered 2022-11-18: 4 mg via ORAL
  Filled 2022-11-18: qty 4

## 2022-11-18 MED ORDER — RISPERIDONE 1 MG PO TABS: 4.0000 mg | ORAL_TABLET | Freq: Two times a day (BID) | ORAL | Status: DC

## 2022-11-18 MED ORDER — INSULIN ASPART 100 UNIT/ML IJ SOLN
0.0000 [IU] | INTRAMUSCULAR | Status: AC
  Administered 2022-11-18 (×2): 3 [IU] via SUBCUTANEOUS
  Administered 2022-11-18: 2 [IU] via SUBCUTANEOUS
  Administered 2022-11-18: 3 [IU] via SUBCUTANEOUS
  Administered 2022-11-19: 2 [IU] via SUBCUTANEOUS

## 2022-11-18 MED ORDER — INSULIN GLARGINE-YFGN 100 UNIT/ML ~~LOC~~ SOLN
5.0000 [IU] | Freq: Every day | SUBCUTANEOUS | Status: DC
  Administered 2022-11-18 – 2022-11-20 (×3): 5 [IU] via SUBCUTANEOUS
  Filled 2022-11-18 (×3): qty 0.05

## 2022-11-18 MED ORDER — SODIUM CHLORIDE 0.9 % IV BOLUS
1000.0000 mL | Freq: Once | INTRAVENOUS | Status: AC
  Administered 2022-11-18: 1000 mL via INTRAVENOUS

## 2022-11-18 MED ORDER — SODIUM CHLORIDE 0.9 % IV SOLN
6.2500 mg | Freq: Four times a day (QID) | INTRAVENOUS | Status: DC | PRN
  Administered 2022-11-19 – 2022-11-21 (×6): 6.25 mg via INTRAVENOUS
  Filled 2022-11-18 (×30): qty 0.25

## 2022-11-18 MED ORDER — POTASSIUM CHLORIDE CRYS ER 20 MEQ PO TBCR
40.0000 meq | EXTENDED_RELEASE_TABLET | Freq: Once | ORAL | Status: AC
  Administered 2022-11-18: 40 meq via ORAL
  Filled 2022-11-18: qty 2

## 2022-11-18 MED ORDER — AMPHETAMINE-DEXTROAMPHET ER 10 MG PO CP24: 20.0000 mg | ORAL_CAPSULE | Freq: Every day | ORAL | Status: DC

## 2022-11-18 MED ORDER — INSULIN GLARGINE-YFGN 100 UNIT/ML ~~LOC~~ SOLN
14.0000 [IU] | Freq: Every day | SUBCUTANEOUS | Status: DC
  Filled 2022-11-18: qty 0.14

## 2022-11-18 MED ORDER — TRAZODONE HCL 50 MG PO TABS
50.0000 mg | ORAL_TABLET | ORAL | Status: DC
  Filled 2022-11-18: qty 1

## 2022-11-18 MED ORDER — QUETIAPINE FUMARATE 50 MG PO TABS
25.0000 mg | ORAL_TABLET | ORAL | Status: DC
  Administered 2022-11-18 – 2022-11-20 (×3): 25 mg via ORAL
  Filled 2022-11-18 (×3): qty 1

## 2022-11-18 MED ORDER — LABETALOL HCL 5 MG/ML IV SOLN
10.0000 mg | INTRAVENOUS | Status: DC | PRN
  Administered 2022-11-18 – 2022-11-20 (×4): 10 mg via INTRAVENOUS
  Filled 2022-11-18 (×4): qty 4

## 2022-11-18 MED ORDER — PANTOPRAZOLE SODIUM 40 MG IV SOLR
40.0000 mg | Freq: Two times a day (BID) | INTRAVENOUS | Status: DC
  Administered 2022-11-18 – 2022-11-19 (×2): 40 mg via INTRAVENOUS
  Filled 2022-11-18 (×2): qty 10

## 2022-11-18 MED ORDER — POTASSIUM CHLORIDE 10 MEQ/100ML IV SOLN
10.0000 meq | INTRAVENOUS | Status: DC
  Filled 2022-11-18: qty 100

## 2022-11-18 MED ORDER — LORAZEPAM 2 MG/ML IJ SOLN
1.0000 mg | INTRAMUSCULAR | Status: DC | PRN
  Administered 2022-11-18 (×2): 1 mg via INTRAVENOUS
  Filled 2022-11-18 (×2): qty 1

## 2022-11-18 MED ORDER — AMPHETAMINE-DEXTROAMPHET ER 10 MG PO CP24
20.0000 mg | ORAL_CAPSULE | ORAL | Status: DC
  Administered 2022-11-22: 20 mg via ORAL
  Filled 2022-11-18 (×5): qty 2

## 2022-11-18 MED ORDER — AMPHETAMINE-DEXTROAMPHET ER 20 MG PO CP24: 20.0000 mg | ORAL_CAPSULE | Freq: Every day | ORAL | Status: DC

## 2022-11-18 MED ORDER — LORAZEPAM 2 MG/ML IJ SOLN
0.5000 mg | Freq: Four times a day (QID) | INTRAMUSCULAR | Status: DC | PRN
  Administered 2022-11-18 – 2022-11-19 (×3): 0.5 mg via INTRAVENOUS
  Filled 2022-11-18 (×3): qty 1

## 2022-11-18 MED ORDER — LIVING WELL WITH DIABETES BOOK
Freq: Once | Status: AC
  Filled 2022-11-18: qty 1

## 2022-11-18 MED ORDER — QUETIAPINE FUMARATE 25 MG PO TABS: 25.0000 mg | ORAL_TABLET | Freq: Every day | ORAL | Status: DC

## 2022-11-18 MED ORDER — POTASSIUM CHLORIDE 10 MEQ/100ML IV SOLN
10.0000 meq | INTRAVENOUS | Status: AC
  Administered 2022-11-18 (×4): 10 meq via INTRAVENOUS
  Filled 2022-11-18 (×3): qty 100

## 2022-11-18 MED ORDER — ONDANSETRON HCL 4 MG/2ML IJ SOLN
4.0000 mg | Freq: Four times a day (QID) | INTRAMUSCULAR | Status: DC | PRN
  Administered 2022-11-18 – 2022-11-20 (×5): 4 mg via INTRAVENOUS
  Filled 2022-11-18 (×5): qty 2

## 2022-11-18 NOTE — Progress Notes (Signed)
Pt has been keeping cell phone laying in the floor at bedside. Educated pt on the risk of the phone being stepped on, damaged and/broken. Also educated pt on hospital not being responsible for missing or damaged personal items such as her cell phone. Pt did not appear to be acknowledging anything RN was saying. Therefore, RN, continued to reinforce this until pt acknowledged so. Encouraged pt to lay phone the bedside table which is within reach of pt. PT angrily stated "ok, I will."

## 2022-11-18 NOTE — Inpatient Diabetes Management (Signed)
Inpatient Diabetes Program Recommendations  AACE/ADA: New Consensus Statement on Inpatient Glycemic Control (2015)  Target Ranges:  Prepandial:   less than 140 mg/dL      Peak postprandial:   less than 180 mg/dL (1-2 hours)      Critically ill patients:  140 - 180 mg/dL   Lab Results  Component Value Date   GLUCAP 158 (H) 11/18/2022   HGBA1C 9.3 (H) 11/18/2022    Review of Glycemic Control  Diabetes history: DM2 Outpatient Diabetes medications: Ozempic 0.5 mg weekly Current orders for Inpatient glycemic control: Semglee 5 units QD, Novolog 0-15 Q4H  HgbA1C - 9.3% Poor po intake  Inpatient Diabetes Program Recommendations:    Agree with orders.   May need meal coverage insulin when po intake improves.  Spoke with pt at bedside regarding her diabetes and home regimen. Rosemarie Ax PCP for diabetes management. Has been on Ozempic since Dec 2023, previously on metformin, although states metformin did not work. Does not monitor blood sugars at home. States her weight has gone up in the past few months. Would like to lose about 15-20 pounds. Discussed both Type 1 and Type 2 DM and differences in each. Pt states she has never received any education about diabetes from her PCP. Has family hx of DM. Pt states she exercises at work by being on her feet and walking a lot. Discussed importance of monitoring blood sugars to know what they are, in order to give accurate info to PCP. iscussed A1C results (9.3% on ) and explained that current A1C indicates an average glucose of 220 mg/dL over the past 2-3 months. Discussed glucose and A1C goals. Discussed importance of checking CBGs and maintaining good CBG control to prevent long-term and short-term complications. Explained how hyperglycemia leads to damage within blood vessels which lead to the common complications seen with uncontrolled diabetes. Stressed to the patient the importance of improving glycemic control to prevent further complications from  uncontrolled diabetes. Discussed impact of nutrition, exercise, stress, sickness, and medications on diabetes control.  Discussed carbohydrates, along with portion sizes. Ordered Living Well with Diabetes book. Pt continues to be nauseated and has poor po intake. Per RN has been vomiting throughout the day.  Will continue to follow. Pt will likely need insulin for home.  Continue to follow.  Thank you. Ailene Ards, RD, LDN, CDCES Inpatient Diabetes Coordinator [phone]

## 2022-11-18 NOTE — Assessment & Plan Note (Signed)
-  Has elevated CBG of 263, elevated anion gap of 18 and beta-hydroxy at 2.45. pH is normal.  -aggressive IV fluid hydration -insulin SSI q4hr  -BMP q4hr  -check A1C

## 2022-11-18 NOTE — Assessment & Plan Note (Signed)
-  resume home meds when able to tolerate p.o

## 2022-11-18 NOTE — Progress Notes (Signed)
PROGRESS NOTE    Alyssa Sanford  ZOX:[id number] DOB: 1995-09-27 DOA: 11/17/2022 PCP: Patient, No Pcp Per   Brief Narrative: 27 year old with past medical history significant for diabetes type 2 hypertension, bipolar disorder presents with intractable nausea and vomiting.  He denies abdominal pain dysuria.  Marijuana.  Evaluation in the ED CBG 263 anion gap 18, beta hydroxy 2.4.  Patient admitted with DKA .  Assessment & Plan:   Principal Problem:   Intractable nausea and vomiting Active Problems:   DKA (diabetic ketoacidosis) (HCC)   HTN (hypertension)   Bipolar 1 disorder (HCC)  1-Early DKA Presents with metabolic acidosis, elevated anion gap, hyperglycemia, Bicarb at 17.  Treated with Fluids, bicarb increasing, gap close.  Start Low dose Semglee, SSI.  Continue with IV fluids.   2-Nausea, vomiting.  Start Schedule Reglan.  PRN Phenergan.  IV fluids.  Could be in setting of early DKA or marihuana use.  Support care.   3-Hypokalemia; Replete IV>   4-ADHD; resume Adderall.   5-Bipolar; resume Seroquel, Risperdal, trazodone.    HTN; PRN labetalol.      Estimated body mass index is 25.88 kg/m as calculated from the following:   Height as of this encounter: 5\' 4"  (1.626 m).   Weight as of this encounter: 68.4 kg.   DVT prophylaxis: SCD Code Status: Full code Family Communication: Care discussed with patient Disposition Plan:  Status is: Observation The patient will require care spanning > 2 midnights and should be moved to inpatient because: management of vomiting.     Consultants:  None  Procedures:    Antimicrobials:    Subjective: She is feeling nauseous. Denies abdominal pain.  She doesn't think she will eat much   Objective: Vitals:   11/18/22 0034 11/18/22 0040 11/18/22 0230 11/18/22 0449  BP:  (!) 172/108 127/78 103/78  Pulse:  86  83  Resp:    (!) 22  Temp:  98.4 F (36.9 C)  98.4 F (36.9 C)  TempSrc:  Oral  Oral  SpO2:  100%   98%  Weight: 68.4 kg     Height: 5\' 4"  (1.626 m)       Intake/Output Summary (Last 24 hours) at 11/18/2022 0745 Last data filed at 11/18/2022 0122 Gross per 24 hour  Intake 2000 ml  Output 800 ml  Net 1200 ml   Filed Weights   11/18/22 0034  Weight: 68.4 kg    Examination:  General exam: Appears calm and comfortable  Respiratory system: Clear to auscultation. Respiratory effort normal. Cardiovascular system: S1 & S2 heard, RRR. No JVD, murmurs, rubs, gallops or clicks. No pedal edema. Gastrointestinal system: Abdomen is nondistended, soft and nontender. No organomegaly or masses felt. Normal bowel sounds heard. Central nervous system: sleepy    Data Reviewed: I have personally reviewed following labs and imaging studies  CBC: Recent Labs  Lab 11/17/22 1817 11/17/22 1952 11/18/22 0441  WBC 9.1  --  9.3  HGB 15.2* 14.6 12.9  HCT 44.5 43.0 37.1  MCV 87.1  --  87.3  PLT 221  --  206   Basic Metabolic Panel: Recent Labs  Lab 11/17/22 1817 11/17/22 1952 11/18/22 0438  NA 137 137 137  K 4.0 3.8 3.2*  CL 98  --  106  CO2 21*  --  17*  GLUCOSE 263*  --  212*  BUN 11  --  7  CREATININE 0.98  --  0.80  CALCIUM 9.9  --  7.8*   GFR: Estimated  Creatinine Clearance: 100.4 mL/min (by C-G formula based on SCr of 0.8 mg/dL). Liver Function Tests: Recent Labs  Lab 11/17/22 1817  AST 29  ALT 31  ALKPHOS 60  BILITOT 0.7  PROT 8.0  ALBUMIN 5.3*   Recent Labs  Lab 11/17/22 1817  LIPASE 32   No results for input(s): "AMMONIA" in the last 168 hours. Coagulation Profile: No results for input(s): "INR", "PROTIME" in the last 168 hours. Cardiac Enzymes: No results for input(s): "CKTOTAL", "CKMB", "CKMBINDEX", "TROPONINI" in the last 168 hours. BNP (last 3 results) No results for input(s): "PROBNP" in the last 8760 hours. HbA1C: Recent Labs    11/18/22 0439  HGBA1C 9.3*   CBG: Recent Labs  Lab 11/17/22 1813 11/17/22 2215 11/18/22 0128 11/18/22 0442   GLUCAP 200* 199* 197* 187*   Lipid Profile: No results for input(s): "CHOL", "HDL", "LDLCALC", "TRIG", "CHOLHDL", "LDLDIRECT" in the last 72 hours. Thyroid Function Tests: No results for input(s): "TSH", "T4TOTAL", "FREET4", "T3FREE", "THYROIDAB" in the last 72 hours. Anemia Panel: No results for input(s): "VITAMINB12", "FOLATE", "FERRITIN", "TIBC", "IRON", "RETICCTPCT" in the last 72 hours. Sepsis Labs: No results for input(s): "PROCALCITON", "LATICACIDVEN" in the last 168 hours.  No results found for this or any previous visit (from the past 240 hour(s)).       Radiology Studies: No results found.      Scheduled Meds:  insulin aspart  0-15 Units Subcutaneous Q4H   potassium chloride  40 mEq Oral Once   Continuous Infusions:  sodium chloride 125 mL/hr at 11/18/22 0250   promethazine (PHENERGAN) injection (IM or IVPB) 25 mg (11/18/22 0606)     LOS: 0 days    Time spent: 35 minutes    Alyssa Zanders A Nasire Reali, MD [facility]   If 7PM-7AM, please contact night-coverage [email or web address]  11/18/2022, 7:45 AM

## 2022-11-18 NOTE — Assessment & Plan Note (Addendum)
-  acute on chronic with reported hx of these symptoms for years. Suspect cannabis hyperemesis with last use yesterday. Also has early DKA which is contributory. Has elevated CBG, beta-hydroxy with elevated anion gap -PRN IV phenergan and IV ativan for refractory symptoms -full liquid diet and advance as needed -treatment for early DKA as below

## 2022-11-18 NOTE — H&P (Signed)
History and Physical    Patient: Alyssa Sanford ZOX:[id number] DOB: 1996/06/04 DOA: 11/17/2022 DOS: the patient was seen and examined on 11/18/2022 PCP: Patient, No Pcp Per  Patient coming from: [facility]  Chief Complaint:  Chief Complaint  Patient presents with   Emesis   HPI: Alyssa Sanford is a 27 y.o. female with medical history significant of Type 2 diabetes, HTN, bipolar disorder who presents with intractable nausea and vomiting.   Pt provides limited hx as she was feeling ill. Says she has issues with nausea and vomiting for years but worse today. No abdominal pain, dysuria or increase urgency or frequency.  Last marijuana use yesterday. Says only Valium has worked for her nausea in the past. Has not been able to keep down medications.   In the ED, afebrile, hypertensive to 179/135.  No leukocytosis. Hgb of 15,2.  Has elevated CBG of 263, elevated anion gap of 18 and beta-hydroxy at 2.45. pH is normal.   UA with >1000 glucose and >80 ketones but negative for leukocytes or nitrate.   UDS +THC.   Symptoms persisted despite IV Valium, Haldo, zofran, compazine and IV fluids in ED so admission was requested.   Review of Systems: As mentioned in the history of present illness. All other systems reviewed and are negative. Past Medical History:  Diagnosis Date   ADHD    Bipolar 1 disorder (HCC)    Depression with anxiety    DM (diabetes mellitus) (HCC)    Hypertension    Impaired glucose tolerance    Strep throat    Tachyarrhythmia    Past Surgical History:  Procedure Laterality Date   AUGMENTATION MAMMAPLASTY Left 2018   saline implant only on left   Social History:  reports that she has been smoking cigarettes. She does not have any smokeless tobacco history on file. She reports current alcohol use. She reports that she does not use drugs.  Allergies  Allergen Reactions   Amoxicillin    Penicillins     Family History  Problem Relation Age of Onset    Healthy Mother    Healthy Father    Breast cancer Maternal Grandmother     Prior to Admission medications   Medication Sig Start Date End Date Taking? Authorizing Provider  amLODipine (NORVASC) 10 MG tablet Take 10 mg by mouth daily.    [provider]  amoxicillin (AMOXIL) 875 MG tablet Take 875 mg by mouth 2 (two) times daily. 07/17/14   [provider]  amphetamine-dextroamphetamine (ADDERALL XR) 25 MG 24 hr capsule Take 25 mg by mouth every morning.    [provider]  atenolol (TENORMIN) 100 MG tablet Take 100 mg by mouth daily.    [provider]  BIOTIN PO Take 2 tablets by mouth daily.    [provider]  clonazePAM (KLONOPIN) 0.5 MG tablet Take 0.5 mg by mouth 2 (two) times daily as needed for anxiety.    [provider]  diphenhydrAMINE (BENADRYL) 25 MG tablet Take 1 tablet (25 mg total) by mouth every 8 (eight) hours as needed. 07/26/14   Sciacca, Marissa, PA-C  diphenhydrAMINE (SOMINEX) 25 MG tablet Take 50 mg by mouth at bedtime as needed for itching, allergies or sleep.    [provider]  etonogestrel (NEXPLANON) 68 MG IMPL implant 1 each by Subdermal route once.    [provider]  LITHIUM PO Take by mouth.    [provider]  METFORMIN HCL PO Take by mouth.  [provider]  norethindrone-ethinyl estradiol-iron (MICROGESTIN FE,GILDESS FE,LOESTRIN FE) 1.5-30 MG-MCG tablet Take 1 tablet by mouth daily.    [provider]  PARoxetine (PAXIL) 40 MG tablet Take 40 mg by mouth every morning.    [provider]  Phenylephrine-DM-GG (TUSSIN CF PO) Take 1 tablet by mouth daily.    [provider]  pseudoephedrine (SUDAFED) 120 MG 12 hr tablet Take 120 mg by mouth 2 (two) times daily.    [provider]  QUEtiapine (SEROQUEL) 25 MG tablet Take 25 mg by mouth at bedtime.    [provider]  risperiDONE (RISPERDAL) 1 MG tablet Take 1 mg by mouth at  bedtime.    [provider]  risperidone (RISPERDAL) 4 MG tablet Take 4 mg by mouth 2 (two) times daily.    [provider]    Physical Exam: Vitals:   11/17/22 2300 11/17/22 2330 11/18/22 0034 11/18/22 0040  BP: (!) 157/102 (!) 161/104  (!) 172/108  Pulse: 86 89  86  Resp:      Temp:    98.4 F (36.9 C)  TempSrc:    Oral  SpO2: 99% 100%  100%  Weight:   68.4 kg   Height:   5\' 4"  (1.626 m)    Constitutional: NAD, calm, comfortable, mildly ill appearing diaphoretic young female laying mostly with eyes closed throughout evaluation Eyes: lids and conjunctivae normal ENMT: Mucous membranes are moist.  Neck: normal, supple Lungs: auscultation bilaterally, no wheezing, no crackles. Normal respiratory effort. No accessory muscle use.  Cardiovascular: Regular rate and rhythm, no murmurs / rubs / gallops. No extremity edema.  Abdomen: soft, non-distended no tenderness, no masses palpated. No rebound tenderness, guarding or rigidity Musculoskeletal: no clubbing / cyanosis. No joint deformity upper and lower extremities. . Normal muscle tone.  Skin: no rashes, lesions, ulcers. No induration Neurologic: CN 2-12 grossly intact.  Psychiatric: Normal judgment and insight. Alert and oriented x 3. Normal mood. Data Reviewed:  See HPI  Assessment and Plan: * Intractable nausea and vomiting -acute on chronic with reported hx of these symptoms for years. Suspect cannabis hyperemesis with last use yesterday. Also has early DKA which is contributory. Has elevated CBG, beta-hydroxy with elevated anion gap -PRN IV phenergan and IV ativan for refractory symptoms -full liquid diet and advance as needed -treatment for early DKA as below  DKA (diabetic ketoacidosis) (HCC) -Has elevated CBG of 263, elevated anion gap of 18 and beta-hydroxy at 2.45. pH is normal.  -aggressive IV fluid hydration -insulin SSI q4hr  -BMP q4hr  -check A1C  Bipolar 1 disorder (HCC) -resume home meds  when able to tolerate p.o  HTN (hypertension) -uncontrolled due to being able to tolerate p.o with GI symptoms -PRN IV labetalol with perimeters -resume home oral antihypertensives when able      Advance Care Planning:Full  Consults: none   Family Communication: none at bedside  Severity of Illness: The appropriate patient status for this patient is OBSERVATION. Observation status is judged to be reasonable and necessary in order to provide the required intensity of service to ensure the patient's safety. The patient's presenting symptoms, physical exam findings, and initial radiographic and laboratory data in the context of their medical condition is felt to place them at decreased risk for further clinical deterioration. Furthermore, it is anticipated that the patient will be medically stable for discharge from the hospital within 2 midnights of admission.   Author: Anselm Jungling, DO 11/18/2022 1:37 AM  For  on call review [email or web address].

## 2022-11-18 NOTE — Progress Notes (Signed)
Hospitalist Transfer Note:  Transferring facility: [facility] Requesting provider: Barrie Dunker (EDP at [facility]) Reason for transfer: admission for further evaluation and management of intractable nausea/vomiting.      27 year old F who presented to [facility] ED complaining of 1 day of recurrent nausea/vomiting associate with nonbloody, nonbilious emesis.  She initially presented to urgent care with this complaint, but after dose of Zofran did not relieve her symptoms, she presented to [facility] emergency department for further evaluation management of her refractory nausea/vomiting.  And spite of multiple doses of antiemetics administered at [facility], including doses of Haldol, Valium, Phenergan, the patient continues to experience recurrent nausea/vomiting And associated inability to tolerate p.o. at this time.   Vital signs in the ED were notable for the following: Afebrile.  Labs were notable for CBG 199 after receiving IV fluids in the ED.  Urinary drug screen is positive for THC.  Subsequently, I accepted this patient for transfer for observation to a med/tele bed at [facility] or [facility]  (first available) for further work-up and management of the above.       Nursing staff, Please call [facility] Admits & Consults System-Wide number on Amion [phone]) as soon as patient's arrival, so appropriate admitting provider can evaluate the pt.     Newton Pigg, DO Hospitalist

## 2022-11-18 NOTE — Assessment & Plan Note (Signed)
-  uncontrolled due to being able to tolerate p.o with GI symptoms -PRN IV labetalol with perimeters -resume home oral antihypertensives when able

## 2022-11-18 NOTE — TOC Progression Note (Signed)
Transition of Care [facility]) - Progression Note    Patient Details  Name: EULAMAE KUNCE MRN: [id number] Date of Birth: 05/22/1996  Transition of Care [facility]) CM/SW Contact  Geni Bers, RN Phone Number: 11/18/2022, 1:23 PM  Clinical Narrative:      Transition of Care (TOC) Screening Note   Patient Details  Name: ZAURIA PITSENBARGER Date of Birth: [birth year]   Transition of Care [facility]) CM/SW Contact:    Geni Bers, RN Phone Number: 11/18/2022, 1:23 PM    Transition of Care Department [facility]) has reviewed patient and no TOC needs have been identified at this time. We will continue to monitor patient advancement through interdisciplinary progression rounds. If new patient transition needs arise, please place a TOC consult.         Expected Discharge Plan and Services                                               Social Determinants of Health (SDOH) Interventions SDOH Screenings   Food Insecurity: No Food Insecurity (11/18/2022)  Housing: Low Risk  (11/18/2022)  Transportation Needs: No Transportation Needs (11/18/2022)  Utilities: Not At Risk (11/18/2022)  Tobacco Use: High Risk (11/17/2022)    Readmission Risk Interventions     No data to display

## 2022-11-19 ENCOUNTER — Inpatient Hospital Stay (HOSPITAL_COMMUNITY): Payer: BC Managed Care – PPO

## 2022-11-19 DIAGNOSIS — R112 Nausea with vomiting, unspecified: Secondary | ICD-10-CM | POA: Diagnosis not present

## 2022-11-19 LAB — BASIC METABOLIC PANEL
Anion gap: 14 (ref 5–15)
Anion gap: 13 (ref 5–15)
Anion gap: 10 (ref 5–15)
Anion gap: 12 (ref 5–15)
BUN: 5 mg/dL — ABNORMAL LOW (ref 6–20)
BUN: <5 mg/dL — ABNORMAL LOW (ref 6–20)
BUN: <5 mg/dL — ABNORMAL LOW (ref 6–20)
BUN: <5 mg/dL — ABNORMAL LOW (ref 6–20)
CO2: 16 mmol/L — ABNORMAL LOW (ref 22–32)
CO2: 17 mmol/L — ABNORMAL LOW (ref 22–32)
CO2: 19 mmol/L — ABNORMAL LOW (ref 22–32)
CO2: 18 mmol/L — ABNORMAL LOW (ref 22–32)
Calcium: 8 mg/dL — ABNORMAL LOW (ref 8.9–10.3)
Calcium: 8.1 mg/dL — ABNORMAL LOW (ref 8.9–10.3)
Calcium: 8 mg/dL — ABNORMAL LOW (ref 8.9–10.3)
Calcium: 8.4 mg/dL — ABNORMAL LOW (ref 8.9–10.3)
Chloride: 103 mmol/L (ref 98–111)
Chloride: 103 mmol/L (ref 98–111)
Chloride: 103 mmol/L (ref 98–111)
Chloride: 105 mmol/L (ref 98–111)
Creatinine, Ser: 0.67 mg/dL (ref 0.44–1.00)
Creatinine, Ser: 0.73 mg/dL (ref 0.44–1.00)
Creatinine, Ser: 0.6 mg/dL (ref 0.44–1.00)
Creatinine, Ser: 0.66 mg/dL (ref 0.44–1.00)
GFR, Estimated: >60 mL/min (ref >60)
GFR, Estimated: >60 mL/min (ref >60)
GFR, Estimated: >60 mL/min (ref >60)
GFR, Estimated: >60 mL/min (ref >60)
Glucose, Bld: 163 mg/dL — ABNORMAL HIGH (ref 70–99)
Glucose, Bld: 154 mg/dL — ABNORMAL HIGH (ref 70–99)
Glucose, Bld: 145 mg/dL — ABNORMAL HIGH (ref 70–99)
Glucose, Bld: 117 mg/dL — ABNORMAL HIGH (ref 70–99)
Potassium: 2.8 mmol/L — ABNORMAL LOW (ref 3.5–5.1)
Potassium: 2.9 mmol/L — ABNORMAL LOW (ref 3.5–5.1)
Potassium: 3.4 mmol/L — ABNORMAL LOW (ref 3.5–5.1)
Potassium: 3.4 mmol/L — ABNORMAL LOW (ref 3.5–5.1)
Sodium: 133 mmol/L — ABNORMAL LOW (ref 135–145)
Sodium: 133 mmol/L — ABNORMAL LOW (ref 135–145)
Sodium: 132 mmol/L — ABNORMAL LOW (ref 135–145)
Sodium: 135 mmol/L (ref 135–145)

## 2022-11-19 LAB — GLUCOSE, CAPILLARY
Glucose-Capillary: 164 mg/dL — ABNORMAL HIGH (ref 70–99)
Glucose-Capillary: 175 mg/dL — ABNORMAL HIGH (ref 70–99)
Glucose-Capillary: 153 mg/dL — ABNORMAL HIGH (ref 70–99)
Glucose-Capillary: 151 mg/dL — ABNORMAL HIGH (ref 70–99)
Glucose-Capillary: 119 mg/dL — ABNORMAL HIGH (ref 70–99)

## 2022-11-19 LAB — LACTIC ACID, PLASMA
Lactic Acid, Venous: 2.4 mmol/L (ref 0.5–1.9)
Lactic Acid, Venous: 1.8 mmol/L (ref 0.5–1.9)

## 2022-11-19 LAB — BETA-HYDROXYBUTYRIC ACID: Beta-Hydroxybutyric Acid: 2.57 mmol/L — ABNORMAL HIGH (ref 0.05–0.27)

## 2022-11-19 MED ORDER — SODIUM CHLORIDE 0.9 % IV BOLUS: 500.0000 mL | Freq: Once | INTRAVENOUS | Status: DC

## 2022-11-19 MED ORDER — POTASSIUM CHLORIDE 10 MEQ/100ML IV SOLN
10.0000 meq | INTRAVENOUS | Status: AC
  Administered 2022-11-19 (×3): 10 meq via INTRAVENOUS
  Filled 2022-11-19 (×3): qty 100

## 2022-11-19 MED ORDER — HYDRALAZINE HCL 20 MG/ML IJ SOLN
10.0000 mg | Freq: Four times a day (QID) | INTRAMUSCULAR | Status: DC | PRN
  Administered 2022-11-19 – 2022-11-22 (×6): 10 mg via INTRAVENOUS
  Filled 2022-11-19 (×7): qty 1

## 2022-11-19 MED ORDER — SODIUM CHLORIDE 0.9 % IV BOLUS
1000.0000 mL | Freq: Once | INTRAVENOUS | Status: AC
  Administered 2022-11-19: 1000 mL via INTRAVENOUS

## 2022-11-19 MED ORDER — ALUM & MAG HYDROXIDE-SIMETH 200-200-20 MG/5ML PO SUSP
15.0000 mL | Freq: Four times a day (QID) | ORAL | Status: DC | PRN
  Administered 2022-11-19 (×2): 15 mL via ORAL
  Filled 2022-11-19 (×2): qty 30

## 2022-11-19 MED ORDER — METOCLOPRAMIDE HCL 5 MG/ML IJ SOLN
5.0000 mg | Freq: Four times a day (QID) | INTRAMUSCULAR | Status: DC
  Administered 2022-11-19 – 2022-11-22 (×11): 5 mg via INTRAVENOUS
  Filled 2022-11-19 (×11): qty 2

## 2022-11-19 MED ORDER — PANTOPRAZOLE SODIUM 40 MG PO TBEC: 40.0000 mg | DELAYED_RELEASE_TABLET | Freq: Two times a day (BID) | ORAL | Status: DC

## 2022-11-19 MED ORDER — INSULIN ASPART 100 UNIT/ML IJ SOLN: 0.0000 [IU] | INTRAMUSCULAR | Status: DC

## 2022-11-19 MED ORDER — PANTOPRAZOLE SODIUM 40 MG IV SOLR
40.0000 mg | Freq: Two times a day (BID) | INTRAVENOUS | Status: DC
  Administered 2022-11-19 – 2022-11-22 (×6): 40 mg via INTRAVENOUS
  Filled 2022-11-19 (×6): qty 10

## 2022-11-19 MED ORDER — RISPERIDONE 1 MG PO TABS
4.0000 mg | ORAL_TABLET | Freq: Two times a day (BID) | ORAL | Status: AC
  Administered 2022-11-19 – 2022-11-20 (×3): 4 mg via ORAL
  Filled 2022-11-19 (×3): qty 4

## 2022-11-19 MED ORDER — DIAZEPAM 5 MG/ML IJ SOLN
2.5000 mg | Freq: Four times a day (QID) | INTRAMUSCULAR | Status: DC | PRN
  Administered 2022-11-19 – 2022-11-22 (×5): 2.5 mg via INTRAVENOUS
  Filled 2022-11-19 (×5): qty 2

## 2022-11-19 MED ORDER — POTASSIUM CHLORIDE CRYS ER 20 MEQ PO TBCR: 20.0000 meq | EXTENDED_RELEASE_TABLET | Freq: Once | ORAL | Status: DC

## 2022-11-19 MED ORDER — POTASSIUM CHLORIDE IN NACL 20-0.9 MEQ/L-% IV SOLN
INTRAVENOUS | Status: DC
  Filled 2022-11-19 (×4): qty 1000

## 2022-11-19 MED ORDER — INSULIN ASPART 100 UNIT/ML IJ SOLN
0.0000 [IU] | INTRAMUSCULAR | Status: DC
  Administered 2022-11-19 – 2022-11-20 (×5): 3 [IU] via SUBCUTANEOUS

## 2022-11-19 NOTE — Progress Notes (Signed)
Promethazine requested form pharmacy x 3. Still has not been received. Diazepam also requested x 1. Waiting for medications at this time.

## 2022-11-19 NOTE — Progress Notes (Addendum)
PROGRESS NOTE    Alyssa DOROTHY  Sanford:[id number] DOB: 11-17-1995 DOA: 11/17/2022 PCP: Moshe Cipro, NP   Brief Narrative: 27 year old with past medical history significant for diabetes type 2 hypertension, bipolar disorder presents with intractable nausea and vomiting.  He denies abdominal pain dysuria.  Marijuana.  Evaluation in the ED CBG 263 anion gap 18, beta hydroxy 2.4.  Patient admitted with DKA .  Assessment & Plan:   Principal Problem:   Intractable nausea and vomiting Active Problems:   DKA (diabetic ketoacidosis) (HCC)   HTN (hypertension)   Bipolar 1 disorder (HCC)  1-Early DKA Presents with metabolic acidosis, elevated anion gap, hyperglycemia, Bicarb at 17.  Treated with Fluids, bicarb increasing, gap close.  Started  Low dose Semglee, SSI.  Continue with IV fluids.  Gap normal, bicarb down to 16. CBG 154. Will give more fluids, and check Beta-butyric acid level.   2-Nausea, vomiting.  Started Schedule Reglan 5/17 PRN Phenergan.  IV fluids.  Could be in setting of early DKA or marihuana use.  Support care.  Continue to have nausea and vomiting. CBG not significantly elevated. Will proceed with CT abdomen pelvis, lactic acid mild elevated 2.5 She was asking for regular food this am, nurse just message me she has continue to vomit.  Addendum; vomiting persist this afternoon. Will change Reglan to Q 6 hours. Will change ativan to valium.   3-Hypokalemia; Replete IV> will add KCL to IV fluids.   4-ADHD; started Adderall.   5-Bipolar; resume Seroquel, Risperdal, trazodone.    HTN; PRN labetalol.   Metabolic Acidosis. Suspect is now related to hypovolemia, vomiting. CBG not elevated.  Lactic acid 2.4. will give more fluids. Check Chest x ray rule out PNA.  Beta-Hydroxybutyric acid ordered.  Will check CT abdomen pelvis.    Estimated body mass index is 25.88 kg/m as calculated from the following:   Height as of this encounter: 5\' 4"  (1.626 m).    Weight as of this encounter: 68.4 kg.   DVT prophylaxis: SCD Code Status: Full code Family Communication: Care discussed with patient Disposition Plan:  Status is: Observation The patient will require care spanning > 2 midnights and should be moved to inpatient because: management of vomiting.     Consultants:  None  Procedures:    Antimicrobials:    Subjective: She feels better than yesterday. When I saw her this am, she was asking for regular food.   Nurse then said patient has been vomiting.    Objective: Vitals:   11/19/22 0649 11/19/22 0757 11/19/22 0900 11/19/22 0915  BP: (!) 159/110 (!) 156/111  121/82  Pulse: 75 85  (!) 110  Resp:   (!) 22   Temp:  99 F (37.2 C)    TempSrc:  Oral    SpO2:  93%  95%  Weight:      Height:        Intake/Output Summary (Last 24 hours) at 11/19/2022 1041 Last data filed at 11/19/2022 1010 Gross per 24 hour  Intake 931.68 ml  Output 2450 ml  Net -1518.32 ml    Filed Weights   11/18/22 0034  Weight: 68.4 kg    Examination:  General exam: NAD Respiratory system: CTA Cardiovascular system: S 1, S 2 RRR Gastrointestinal system: BS present, soft, nt Central nervous system: Alert, conversant.     Data Reviewed: I have personally reviewed following labs and imaging studies  CBC: Recent Labs  Lab 11/17/22 1817 11/17/22 1952 11/18/22 0441  WBC 9.1  --  9.3  HGB 15.2* 14.6 12.9  HCT 44.5 43.0 37.1  MCV 87.1  --  87.3  PLT 221  --  206    Basic Metabolic Panel: Recent Labs  Lab 11/18/22 0438 11/18/22 0918 11/18/22 1232 11/18/22 1725 11/19/22 0826  NA 137 134* 135 134* 133*  K 3.2* 3.2* 3.1* 3.0* 2.8*  CL 106 105 104 104 103  CO2 17* 18* 19* 19* 16*  GLUCOSE 212* 204* 157* 151* 163*  BUN 7 6 6  <5* 5*  CREATININE 0.80 0.74 0.68 0.81 0.67  CALCIUM 7.8* 7.8* 8.2* 8.2* 8.0*    GFR: Estimated Creatinine Clearance: 100.4 mL/min (by C-G formula based on SCr of 0.67 mg/dL). Liver Function Tests: Recent  Labs  Lab 11/17/22 1817  AST 29  ALT 31  ALKPHOS 60  BILITOT 0.7  PROT 8.0  ALBUMIN 5.3*    Recent Labs  Lab 11/17/22 1817  LIPASE 32    No results for input(s): "AMMONIA" in the last 168 hours. Coagulation Profile: No results for input(s): "INR", "PROTIME" in the last 168 hours. Cardiac Enzymes: No results for input(s): "CKTOTAL", "CKMB", "CKMBINDEX", "TROPONINI" in the last 168 hours. BNP (last 3 results) No results for input(s): "PROBNP" in the last 8760 hours. HbA1C: Recent Labs    11/18/22 0439  HGBA1C 9.3*    CBG: Recent Labs  Lab 11/18/22 1638 11/18/22 2010 11/18/22 2352 11/19/22 0419 11/19/22 0753  GLUCAP 148* 120* 123* 164* 175*    Lipid Profile: No results for input(s): "CHOL", "HDL", "LDLCALC", "TRIG", "CHOLHDL", "LDLDIRECT" in the last 72 hours. Thyroid Function Tests: No results for input(s): "TSH", "T4TOTAL", "FREET4", "T3FREE", "THYROIDAB" in the last 72 hours. Anemia Panel: No results for input(s): "VITAMINB12", "FOLATE", "FERRITIN", "TIBC", "IRON", "RETICCTPCT" in the last 72 hours. Sepsis Labs: No results for input(s): "PROCALCITON", "LATICACIDVEN" in the last 168 hours.  No results found for this or any previous visit (from the past 240 hour(s)).       Radiology Studies: No results found.      Scheduled Meds:  amphetamine-dextroamphetamine  20 mg Oral Q24H   amphetamine-dextroamphetamine  10 mg Oral Q24H   insulin aspart  0-15 Units Subcutaneous Q4H   insulin glargine-yfgn  5 Units Subcutaneous Daily   metoCLOPramide (REGLAN) injection  5 mg Intravenous Q8H   pantoprazole  40 mg Oral BID   potassium chloride  20 mEq Oral Once   QUEtiapine  25 mg Oral Once per day on Fri Sat   risperiDONE  4 mg Oral QHS   [START ON 11/20/2022] traZODone  50 mg Oral Once per day on Sun Mon Tue Wed Thu   Continuous Infusions:  0.9 % NaCl with KCl 20 mEq / L     potassium chloride 10 mEq (11/19/22 1010)   potassium chloride     promethazine  (PHENERGAN) injection (IM or IVPB) 6.25 mg (11/19/22 0656)   sodium chloride       LOS: 1 day    Time spent: 35 minutes    Aj Crunkleton A Kristilyn Coltrane, MD [facility]   If 7PM-7AM, please contact night-coverage [email or web address]  11/19/2022, 10:41 AM

## 2022-11-19 NOTE — Progress Notes (Signed)
Spoke with Dr Sunnie Nielsen via telephone earlier after notifying her of critical lactic acid level. With the multiple medications and boluses neede3d to be given along with maintenance NS with K, a 2nd IV is approved by Dr Sunnie Nielsen. IV consult ordered. Hye Mikle Bosworth RN reluctant to place 2nd PIV. This RN requested to have the 2nd PIV placed. Lee RN at bedside to place at this time.

## 2022-11-20 ENCOUNTER — Encounter (HOSPITAL_COMMUNITY): Payer: Self-pay | Admitting: Internal Medicine

## 2022-11-20 ENCOUNTER — Inpatient Hospital Stay (HOSPITAL_COMMUNITY): Payer: BC Managed Care – PPO

## 2022-11-20 DIAGNOSIS — R112 Nausea with vomiting, unspecified: Secondary | ICD-10-CM | POA: Diagnosis not present

## 2022-11-20 LAB — URINALYSIS, ROUTINE W REFLEX MICROSCOPIC
Bilirubin Urine: NEGATIVE
Glucose, UA: 150 mg/dL — AB
Ketones, ur: 20 mg/dL — AB
Leukocytes,Ua: NEGATIVE
Nitrite: NEGATIVE
Protein, ur: NEGATIVE mg/dL
Specific Gravity, Urine: 1.008 (ref 1.005–1.030)
pH: 6 (ref 5.0–8.0)

## 2022-11-20 LAB — HEPATIC FUNCTION PANEL
ALT: 28 U/L (ref 0–44)
AST: 29 U/L (ref 15–41)
Albumin: 4 g/dL (ref 3.5–5.0)
Alkaline Phosphatase: 50 U/L (ref 38–126)
Bilirubin, Direct: 0.1 mg/dL (ref 0.0–0.2)
Indirect Bilirubin: 1.1 mg/dL — ABNORMAL HIGH (ref 0.3–0.9)
Total Bilirubin: 1.2 mg/dL (ref 0.3–1.2)
Total Protein: 7 g/dL (ref 6.5–8.1)

## 2022-11-20 LAB — GLUCOSE, CAPILLARY
Glucose-Capillary: 114 mg/dL — ABNORMAL HIGH (ref 70–99)
Glucose-Capillary: 119 mg/dL — ABNORMAL HIGH (ref 70–99)
Glucose-Capillary: 141 mg/dL — ABNORMAL HIGH (ref 70–99)
Glucose-Capillary: 145 mg/dL — ABNORMAL HIGH (ref 70–99)
Glucose-Capillary: 145 mg/dL — ABNORMAL HIGH (ref 70–99)
Glucose-Capillary: 155 mg/dL — ABNORMAL HIGH (ref 70–99)
Glucose-Capillary: 159 mg/dL — ABNORMAL HIGH (ref 70–99)
Glucose-Capillary: 162 mg/dL — ABNORMAL HIGH (ref 70–99)
Glucose-Capillary: 164 mg/dL — ABNORMAL HIGH (ref 70–99)
Glucose-Capillary: 165 mg/dL — ABNORMAL HIGH (ref 70–99)
Glucose-Capillary: 169 mg/dL — ABNORMAL HIGH (ref 70–99)
Glucose-Capillary: 178 mg/dL — ABNORMAL HIGH (ref 70–99)

## 2022-11-20 LAB — CBC
HCT: 42.1 % (ref 36.0–46.0)
Hemoglobin: 14 g/dL (ref 12.0–15.0)
MCH: 30 pg (ref 26.0–34.0)
MCHC: 33.3 g/dL (ref 30.0–36.0)
MCV: 90.3 fL (ref 80.0–100.0)
Platelets: 200 10*3/uL (ref 150–400)
RBC: 4.66 MIL/uL (ref 3.87–5.11)
RDW: 12.4 % (ref 11.5–15.5)
WBC: 7.6 10*3/uL (ref 4.0–10.5)
nRBC: 0 % (ref 0.0–0.2)

## 2022-11-20 LAB — BASIC METABOLIC PANEL
Anion gap: 11 (ref 5–15)
Anion gap: 11 (ref 5–15)
Anion gap: 12 (ref 5–15)
Anion gap: 12 (ref 5–15)
BUN: <5 mg/dL — ABNORMAL LOW (ref 6–20)
BUN: <5 mg/dL — ABNORMAL LOW (ref 6–20)
BUN: <5 mg/dL — ABNORMAL LOW (ref 6–20)
BUN: <5 mg/dL — ABNORMAL LOW (ref 6–20)
CO2: 16 mmol/L — ABNORMAL LOW (ref 22–32)
CO2: 17 mmol/L — ABNORMAL LOW (ref 22–32)
CO2: 18 mmol/L — ABNORMAL LOW (ref 22–32)
CO2: 18 mmol/L — ABNORMAL LOW (ref 22–32)
Calcium: 8.1 mg/dL — ABNORMAL LOW (ref 8.9–10.3)
Calcium: 8.2 mg/dL — ABNORMAL LOW (ref 8.9–10.3)
Calcium: 8.2 mg/dL — ABNORMAL LOW (ref 8.9–10.3)
Calcium: 8.4 mg/dL — ABNORMAL LOW (ref 8.9–10.3)
Chloride: 102 mmol/L (ref 98–111)
Chloride: 102 mmol/L (ref 98–111)
Chloride: 105 mmol/L (ref 98–111)
Chloride: 105 mmol/L (ref 98–111)
Creatinine, Ser: 0.63 mg/dL (ref 0.44–1.00)
Creatinine, Ser: 0.67 mg/dL (ref 0.44–1.00)
Creatinine, Ser: 0.8 mg/dL (ref 0.44–1.00)
Creatinine, Ser: 0.8 mg/dL (ref 0.44–1.00)
GFR, Estimated: >60 mL/min (ref >60)
GFR, Estimated: >60 mL/min (ref >60)
GFR, Estimated: >60 mL/min (ref >60)
GFR, Estimated: >60 mL/min (ref >60)
Glucose, Bld: 109 mg/dL — ABNORMAL HIGH (ref 70–99)
Glucose, Bld: 128 mg/dL — ABNORMAL HIGH (ref 70–99)
Glucose, Bld: 147 mg/dL — ABNORMAL HIGH (ref 70–99)
Glucose, Bld: 171 mg/dL — ABNORMAL HIGH (ref 70–99)
Potassium: 3.4 mmol/L — ABNORMAL LOW (ref 3.5–5.1)
Potassium: 3.6 mmol/L (ref 3.5–5.1)
Potassium: 3.6 mmol/L (ref 3.5–5.1)
Potassium: 4.3 mmol/L (ref 3.5–5.1)
Sodium: 130 mmol/L — ABNORMAL LOW (ref 135–145)
Sodium: 132 mmol/L — ABNORMAL LOW (ref 135–145)
Sodium: 133 mmol/L — ABNORMAL LOW (ref 135–145)
Sodium: 134 mmol/L — ABNORMAL LOW (ref 135–145)

## 2022-11-20 LAB — BETA-HYDROXYBUTYRIC ACID
Beta-Hydroxybutyric Acid: 3.7 mmol/L — ABNORMAL HIGH (ref 0.05–0.27)
Beta-Hydroxybutyric Acid: 2.98 mmol/L — ABNORMAL HIGH (ref 0.05–0.27)

## 2022-11-20 LAB — LIPASE, BLOOD: Lipase: 55 U/L — ABNORMAL HIGH (ref 11–51)

## 2022-11-20 MED ORDER — SODIUM CHLORIDE 0.9 % IV BOLUS
1000.0000 mL | Freq: Once | INTRAVENOUS | Status: AC
  Administered 2022-11-20: 1000 mL via INTRAVENOUS

## 2022-11-20 MED ORDER — POTASSIUM CHLORIDE 10 MEQ/100ML IV SOLN
10.0000 meq | INTRAVENOUS | Status: AC
  Administered 2022-11-20: 10 meq via INTRAVENOUS
  Filled 2022-11-20 (×2): qty 100

## 2022-11-20 MED ORDER — INSULIN REGULAR(HUMAN) IN NACL 100-0.9 UT/100ML-% IV SOLN: INTRAVENOUS | Status: DC

## 2022-11-20 MED ORDER — IOHEXOL 300 MG/ML  SOLN
100.0000 mL | Freq: Once | INTRAMUSCULAR | Status: AC | PRN
  Administered 2022-11-20: 100 mL via INTRAVENOUS

## 2022-11-20 MED ORDER — SODIUM CHLORIDE (PF) 0.9 % IJ SOLN
INTRAMUSCULAR | Status: AC
  Filled 2022-11-20: qty 50

## 2022-11-20 MED ORDER — DEXTROSE-NACL 5-0.9 % IV SOLN: INTRAVENOUS | Status: DC

## 2022-11-20 MED ORDER — LACTATED RINGERS IV SOLN
INTRAVENOUS | Status: DC
Start: 2022-11-20 — End: 2022-11-20

## 2022-11-20 MED ORDER — DEXTROSE 50 % IV SOLN: 0.0000 mL | INTRAVENOUS | Status: DC | PRN

## 2022-11-20 MED ORDER — CHLORHEXIDINE GLUCONATE CLOTH 2 % EX PADS
6.0000 | MEDICATED_PAD | Freq: Every day | CUTANEOUS | Status: DC
  Administered 2022-11-20: 6 via TOPICAL

## 2022-11-20 MED ORDER — POTASSIUM CHLORIDE 10 MEQ/100ML IV SOLN: 10.0000 meq | INTRAVENOUS | Status: DC

## 2022-11-20 MED ORDER — ONDANSETRON HCL 4 MG/2ML IJ SOLN
4.0000 mg | Freq: Three times a day (TID) | INTRAMUSCULAR | Status: DC
  Administered 2022-11-20 – 2022-11-22 (×5): 4 mg via INTRAVENOUS
  Filled 2022-11-20 (×5): qty 2

## 2022-11-20 MED ORDER — LACTATED RINGERS IV BOLUS: 1000.0000 mL | Freq: Once | INTRAVENOUS | Status: DC

## 2022-11-20 MED ORDER — POTASSIUM CHLORIDE 10 MEQ/100ML IV SOLN
10.0000 meq | INTRAVENOUS | Status: DC
  Administered 2022-11-20 (×3): 10 meq via INTRAVENOUS
  Filled 2022-11-20 (×3): qty 100

## 2022-11-20 MED ORDER — RISPERIDONE 1 MG PO TABS
8.0000 mg | ORAL_TABLET | Freq: Every day | ORAL | Status: DC
  Administered 2022-11-21: 8 mg via ORAL
  Filled 2022-11-20: qty 8

## 2022-11-20 MED ORDER — DEXTROSE IN LACTATED RINGERS 5 % IV SOLN: INTRAVENOUS | Status: DC

## 2022-11-20 MED ORDER — POTASSIUM CHLORIDE 10 MEQ/100ML IV SOLN
10.0000 meq | INTRAVENOUS | Status: AC
  Administered 2022-11-20 (×2): 10 meq via INTRAVENOUS
  Filled 2022-11-20 (×2): qty 100

## 2022-11-20 MED ORDER — ONDANSETRON HCL 4 MG/2ML IJ SOLN: 4.0000 mg | Freq: Three times a day (TID) | INTRAMUSCULAR | Status: DC

## 2022-11-20 MED ORDER — DEXTROSE 5 % IV SOLN: INTRAVENOUS | Status: DC

## 2022-11-20 MED ORDER — INSULIN (MYXREDLIN) INFUSION FOR HYPERTRIGLYCERIDEMIA
1.0000 [IU]/h | INTRAVENOUS | Status: DC
  Administered 2022-11-20: 0.5 [IU]/h via INTRAVENOUS
  Filled 2022-11-20: qty 100

## 2022-11-20 NOTE — Progress Notes (Signed)
Discussed with patient sharing HIPAA information with patient's parents Ambulance person) as the patient's father was asking several questions about the patients current status and possible causes of consistent n/v. The patient gave this nurse verbal approval to discuss all medically related information with the patient's mother, Alyssa Sanford, only. This nurse will pass this information on to the next nurse on shift.

## 2022-11-20 NOTE — Consult Note (Signed)
[facility] Consultation Note  Referring Provider: [facility] Primary Care Physician:  Moshe Cipro, NP Primary Gastroenterologist:  [facility]  Reason for Consultation:  nausea and vomiting  HPI: Alyssa Sanford is a 27 y.o. female Patient has had intermittent problems with nausea and vomiting for about 4 years.  She has seen [facility], most recently few months ago, and reports they are planning on doing endoscopy and some other tests.  She  has history of diabetes, previously had been on ozempic and now is on insulin.  She states she stopped checking her blood sugars at home after some consecutive normal values.  She reports also troubles with lower abdominal pain and constipation and diarrhea.  No blood in stool.  No weight loss.  She has had some bloating.  She reports she gets nauseated and vomiting sometimes after eating (and sometimes not) and sometimes also gets nausea and vomiting when she is hungry.   Past Medical History:  Diagnosis Date   ADHD    Bipolar 1 disorder (HCC)    Depression with anxiety    DM (diabetes mellitus) (HCC)    Hypertension    Impaired glucose tolerance    Strep throat    Tachyarrhythmia     Past Surgical History:  Procedure Laterality Date   AUGMENTATION MAMMAPLASTY Left 2018   saline implant only on left    Prior to Admission medications   Medication Sig Start Date End Date Taking? Authorizing Provider  amLODipine (NORVASC) 2.5 MG tablet Take 2.5 mg by mouth daily. 02/08/17  Yes [provider]  amphetamine-dextroamphetamine (ADDERALL XR) 10 MG 24 hr capsule Take 10 mg by mouth daily as needed (in afternoon for attention.). 07/29/21  Yes [provider]  amphetamine-dextroamphetamine (ADDERALL XR) 25 MG 24 hr capsule Take 25 mg by mouth every morning.   Yes [provider]  amphetamine-dextroamphetamine (ADDERALL) 10 MG tablet Take 10 mg by mouth daily as needed (Attention). 11/07/22  Yes  [provider]  atenolol (TENORMIN) 100 MG tablet Take 100 mg by mouth daily.   Yes [provider]  clonazePAM (KLONOPIN) 0.5 MG tablet Take 0.5 mg by mouth 2 (two) times daily as needed for anxiety.   Yes [provider]  diphenhydrAMINE (SOMINEX) 25 MG tablet Take 50 mg by mouth at bedtime as needed for itching, allergies or sleep.   Yes [provider]  Norethindrone Acetate-Ethinyl Estradiol (JUNEL 1.5/30) 1.5-30 MG-MCG tablet Take 1 tablet by mouth daily.   Yes [provider]  ondansetron (ZOFRAN-ODT) 4 MG disintegrating tablet Take 4 mg by mouth every 6 (six) hours as needed for nausea or vomiting. 10/05/22  Yes [provider]  OZEMPIC, 2 MG/DOSE, 8 MG/3ML SOPN Inject 3 mLs into the skin once a week. 10/11/22  Yes [provider]  pantoprazole (PROTONIX) 20 MG tablet Take 20 mg by mouth daily. 10/10/22  Yes [provider]  QUEtiapine (SEROQUEL) 25 MG tablet Take 25 mg by mouth at bedtime.   Yes [provider]  risperidone (RISPERDAL) 4 MG tablet Take 4 mg by mouth 2 (two) times daily.   Yes [provider]  traZODone (DESYREL) 50 MG tablet Take 50-100 mg by mouth See admin instructions. Take 50-100 mg (1-2 tablets) by mouth daily on the weekdays. 07/09/21  Yes [provider]  famotidine (PEPCID) 40 MG tablet Take 40 mg by mouth 2 (two) times daily. Patient not taking: Reported on 11/18/2022 07/12/22   [provider]  Current Facility-Administered Medications  Medication Dose Route Frequency Provider Last Rate Last Admin   0.9 % NaCl with KCl 20 mEq/ L  infusion   Intravenous Continuous Regalado, Belkys A, MD 100 mL/hr at 11/20/22 1320 New Bag at 11/20/22 1320   alum & mag hydroxide-simeth (MAALOX/MYLANTA) 200-200-20 MG/5ML suspension 15 mL  15 mL Oral Q6H PRN Regalado, Belkys A, MD   15 mL at 11/19/22 1458   amphetamine-dextroamphetamine (ADDERALL XR) 24 hr capsule 20 mg  20 mg Oral  Q24H Regalado, Belkys A, MD       amphetamine-dextroamphetamine (ADDERALL) tablet 10 mg  10 mg Oral Q24H Regalado, Belkys A, MD   10 mg at 11/20/22 1309   diazepam (VALIUM) injection 2.5 mg  2.5 mg Intravenous Q6H PRN Regalado, Belkys A, MD   2.5 mg at 11/20/22 1610   hydrALAZINE (APRESOLINE) injection 10 mg  10 mg Intravenous Q6H PRN Regalado, Belkys A, MD   10 mg at 11/19/22 0813   insulin aspart (novoLOG) injection 0-15 Units  0-15 Units Subcutaneous Q4H Regalado, Belkys A, MD   3 Units at 11/20/22 1253   insulin glargine-yfgn (SEMGLEE) injection 5 Units  5 Units Subcutaneous Daily Regalado, Belkys A, MD   5 Units at 11/20/22 0912   labetalol (NORMODYNE) injection 10 mg  10 mg Intravenous Q2H PRN Howerter, Justin B, DO   10 mg at 11/19/22 0657   metoCLOPramide (REGLAN) injection 5 mg  5 mg Intravenous Q6H Regalado, Belkys A, MD   5 mg at 11/20/22 1253   ondansetron (ZOFRAN) injection 4 mg  4 mg Intravenous Q6H PRN Regalado, Belkys A, MD   4 mg at 11/20/22 1113   pantoprazole (PROTONIX) injection 40 mg  40 mg Intravenous Q12H Regalado, Belkys A, MD   40 mg at 11/20/22 0912   promethazine (PHENERGAN) 6.25 mg in sodium chloride 0.9 % 50 mL IVPB  6.25 mg Intravenous Q6H PRN Regalado, Belkys A, MD 200 mL/hr at 11/20/22 1358 6.25 mg at 11/20/22 1358   QUEtiapine (SEROQUEL) tablet 25 mg  25 mg Oral Once per day on Fri Sat Regalado, Belkys A, MD   25 mg at 11/19/22 2148   risperiDONE (RISPERDAL) tablet 4 mg  4 mg Oral BID Regalado, Belkys A, MD   4 mg at 11/20/22 0912   [START ON 11/21/2022] risperiDONE (RISPERDAL) tablet 8 mg  8 mg Oral QHS Regalado, Belkys A, MD       traZODone (DESYREL) tablet 50 mg  50 mg Oral Once per day on Sun Mon Tue Wed Thu Regalado, Jon Billings A, MD        Allergies as of 11/17/2022 - Reviewed 11/17/2022  Allergen Reaction Noted   Amoxicillin  11/17/2022   Penicillins  06/04/2021    Family History  Problem Relation Age of Onset   Healthy Mother    Healthy Father    Breast  cancer Maternal Grandmother     Social History   Socioeconomic History   Marital status: Single    Spouse name: Not on file   Number of children: Not on file   Years of education: Not on file   Highest education level: Not on file  Occupational History   Not on file  Tobacco Use   Smoking status: Every Day    Types: Cigarettes   Smokeless tobacco: Not on file  Substance and Sexual Activity   Alcohol use: Yes   Drug use: No   Sexual activity: Yes    Birth control/protection: Pill  Other Topics Concern   Not on file  Social History Narrative   Not on file   Social Determinants of Health   Financial Resource Strain: Not on file  Food Insecurity: No Food Insecurity (11/18/2022)   Hunger Vital Sign    Worried About Running Out of Food in the Last Year: Never true    Ran Out of Food in the Last Year: Never true  Transportation Needs: No Transportation Needs (11/18/2022)   PRAPARE - Administrator, Civil Service (Medical): No    Lack of Transportation (Non-Medical): No  Physical Activity: Not on file  Stress: Not on file  Social Connections: Not on file  Intimate Partner Violence: Not At Risk (11/18/2022)   Humiliation, Afraid, Rape, and Kick questionnaire    Fear of Current or Ex-Partner: No    Emotionally Abused: No    Physically Abused: No    Sexually Abused: No    Review of Systems: As per HPI all others negative  Physical Exam: Vital signs in last 24 hours: Temp:  [98.4 F (36.9 C)-99.1 F (37.3 C)] 98.4 F (36.9 C) (05/19 0413) Pulse Rate:  [79-102] 102 (05/19 0413) Resp:  [19-20] 20 (05/19 0413) BP: (137-157)/(90-117) 139/90 (05/19 0413) SpO2:  [96 %-97 %] 96 % (05/19 0413) Last BM Date : 11/17/22 General:   Alert,  Well-developed, well-nourished, pleasant and cooperative in NAD Head:  Normocephalic and atraumatic. Eyes:  Sclera clear, no icterus.   Conjunctiva pink. Ears:  Normal auditory acuity. Nose:  No deformity, discharge,  or  lesions. Mouth:  No deformity or lesions.  Oropharynx pink & moist. Neck:  Supple; no masses or thyromegaly. Lungs:  No respiratory distress Abdomen:  Soft, mild generalized tenderness, No masses, hepatosplenomegaly or hernias noted. No peritonitis Msk:  Symmetrical without gross deformities. Normal posture. Pulses:  Normal pulses noted. Extremities:  Without clubbing or edema. Neurologic:  Alert and  oriented x4;  grossly normal neurologically. Skin:  Intact without significant lesions or rashes. Psych:  Alert and cooperative. Frustrated and stressed-appearing   Lab Results: Recent Labs    11/17/22 1817 11/17/22 1952 11/18/22 0441 11/20/22 0024  WBC 9.1  --  9.3 7.6  HGB 15.2* 14.6 12.9 14.0  HCT 44.5 43.0 37.1 42.1  PLT 221  --  206 200   BMET Recent Labs    11/19/22 2013 11/20/22 0024 11/20/22 1410  NA 135 133* 130*  K 3.4* 3.4* 4.3  CL 105 105 102  CO2 18* 17* 16*  GLUCOSE 117* 147* 128*  BUN <5* <5* <5*  CREATININE 0.66 0.63 0.80  CALCIUM 8.4* 8.2* 8.1*   LFT Recent Labs    11/17/22 1817  PROT 8.0  ALBUMIN 5.3*  AST 29  ALT 31  ALKPHOS 60  BILITOT 0.7   PT/INR No results for input(s): "LABPROT", "INR" in the last 72 hours.  Studies/Results: CT ABDOMEN PELVIS W CONTRAST  Result Date: 11/20/2022 CLINICAL DATA:  27 year old female with intractable vomiting. EXAM: CT ABDOMEN AND PELVIS WITH CONTRAST TECHNIQUE: Multidetector CT imaging of the abdomen and pelvis was performed using the standard protocol following bolus administration of intravenous contrast. RADIATION DOSE REDUCTION: This exam was performed according to the departmental dose-optimization program which includes automated exposure control, adjustment of the mA and/or kV according to patient size and/or use of iterative reconstruction technique. CONTRAST:  OMNIPAQUE IOHEXOL 300 MG/ML  SOLN COMPARISON:  07/07/2022 MR, 07/01/2022 ultrasound. FINDINGS: Lower chest: No acute abnormality. Portion  of a LEFT breast  implant noted. Hepatobiliary: Moderate diffuse hepatic steatosis again identified. A 3.1 cm enhancing mass within the RIGHT liver (images 14-18: Series 3) is unchanged and characterized as focal nodular hyperplasia on 07/07/2022 abdominal MR. The gallbladder is unremarkable. There is no evidence of intrahepatic or extrahepatic biliary dilatation. Pancreas: Unremarkable Spleen: Unremarkable Adrenals/Urinary Tract: The kidneys, adrenal glands and bladder are unremarkable. Stomach/Bowel: Stomach is within normal limits. Appendix appears normal. No evidence of bowel wall thickening, distention, or inflammatory changes. Vascular/Lymphatic: No significant vascular findings are present. No enlarged abdominal or pelvic lymph nodes. Reproductive: Uterus and bilateral adnexa are unremarkable. Other: No ascites, focal collection or pneumoperitoneum. Musculoskeletal: No acute or suspicious bony abnormalities are noted. IMPRESSION: 1. No evidence of acute abnormality. 2. Moderate diffuse hepatic steatosis again noted. 3. Unchanged 3.1 cm RIGHT hepatic focal nodular hyperplasia. Electronically Signed   By: Harmon Pier M.D.   On: 11/20/2022 13:50   DG CHEST PORT 1 VIEW  Result Date: 11/19/2022 CLINICAL DATA:  Lactic acidosis. Diabetes and hypertension. Left-sided breast augmentation. EXAM: PORTABLE CHEST 1 VIEW COMPARISON:  None Available. FINDINGS: Numerous leads and wires project over the chest. Midline trachea. Normal heart size and mediastinal contours. No pleural effusion or pneumothorax. Clear lungs. Increased density projecting over the inferior left hemithorax, consistent with unilateral breast augmentation. IMPRESSION: No acute cardiopulmonary disease. Electronically Signed   By: Jeronimo Greaves M.D.   On: 11/19/2022 15:09    Impression:   Nausea and vomiting, acute on chronic.  Suspect based on retained food in stomach on endoscopy 2020 that patient has component of (diabetic) gastroparesis.  In and  of itself, diabetic ketoacidosis as well as marijuana use can also lend towards significant nausea/vomiting. Ozempic has been asserted to potentially cause irreversible gastroparesis in patients even after they stop using it.  Suspect stress/anxiety also playing role.  Plan:   I reviewed trying to cut back on diet temporarily given her ongoing nausea/vomiting symptoms, and stop marijuana use.   I discussed association of diabetes and ozempic with gastroparesis. I have emphasized importance of glycemic control and regular assessment of home glucose levels. Patient is very frustrated with her chronic nausea and vomiting symptoms but does not seem receptive to my suggestions, instead seeking to find out the definitive root cause which triggered her symptom onset in 2020; I advised her that this may be difficult to do with any degree of certainty, but that I see multiple potential factors (causative versus associative) contributing in some way to her nausea/vomiting. If patient has ongoing nausea/vomiting after the changes she is currently undergoing in her diet, I have suggested we should cut back diet to clear liquids, do scheduled antiemetics, and consider endoscopy +/- gastric emptying study.   [facility] available as needed.  I have discussed case with Dr. Sunnie Nielsen.   LOS: 2 days   Srah Ake M  11/20/2022, 2:51 PM  Cell [phone] If no answer or after 5 PM call [phone]

## 2022-11-20 NOTE — Progress Notes (Addendum)
PROGRESS NOTE    Alyssa Sanford  ZOX:[id number] DOB: Jul 04, 1996 DOA: 11/17/2022 PCP: Moshe Cipro, NP   Brief Narrative: 27 year old with past medical history significant for diabetes type 2 hypertension, bipolar disorder presents with intractable nausea and vomiting.  He denies abdominal pain dysuria.  Marijuana.  Evaluation in the ED CBG 263 anion gap 18, beta hydroxy 2.4.  Patient admitted with DKA .  Assessment & Plan:   Principal Problem:   Intractable nausea and vomiting Active Problems:   DKA (diabetic ketoacidosis) (HCC)   HTN (hypertension)   Bipolar 1 disorder (HCC)  1-Early DKA Presents with metabolic acidosis, elevated anion gap at 18, hyperglycemia, Bicarb at 17.  Started  Low dose Semglee, SSI.  Continue with IV fluids.  Gap has been close, low bicarb could be related to frequent vomiting.  I will recheck Beta-hydroxybutyric  acid if elevated, will transfer patient to step down unit , start low rate insulin gtt and D 5.  Addendum;  Beta-hydroxybutyric acid higher at 3.7, bicarb lower at 16, gap at 13--might be higher by albumin. Plan to transfer to Step down/ICU for low rate insulin gtt and D 5.   2-Nausea, vomiting.  Started Schedule Reglan 5/17 PRN Phenergan.  Could be in setting of early DKA or marihuana use.  Support care.  Symptoms persist. Will consult GI.  CT abdomen pelvis ordered.  Will proceed with CT head.   3-Hypokalemia; Continue with KCL on IV fluids, received Kcl runs.   4-ADHD; on  Adderall.   5-Bipolar; Resume Seroquel, Risperdal, trazodone.   She takes risperidone 8 mg at bed time. She doesn't take BID>  Will change dose starting tomorrow.   HTN; PRN labetalol.   Metabolic Acidosis. Suspect is now related to hypovolemia, vomiting. CBG not elevated.  Lactic acid 2.4. related to hypovolemia. Resolved with Fluids. CT abdomen pelvis negative for infection Beta-Hydroxybutyric acid ordered.  CT abdomen pelvis negative.    Moderate diffuse hepatic steatosis again noted. Unchanged 3.1 cm RIGHT hepatic focal nodular hyperplasia. -needs follow up out patient.   Estimated body mass index is 25.88 kg/m as calculated from the following:   Height as of this encounter: 5\' 4"  (1.626 m).   Weight as of this encounter: 68.4 kg.   DVT prophylaxis: SCD Code Status: Full code Family Communication: Care discussed with patient and parents at bedside.  Disposition Plan:  Status is: Observation The patient will require care spanning > 2 midnights and should be moved to inpatient because: management of vomiting.     Consultants:  None  Procedures:    Antimicrobials:    Subjective: She is was feeling better this morning when I saw her. She was able to eat fruit cups, and juice.   In the afternoon she ate small amount of mash potatoes.  She vomited this afternoon, fluid content.  Parent at bedside, we discussed care. They think she is some what confuse. She didn't remember her mom was here yesterday.     Objective: Vitals:   11/19/22 1300 11/19/22 2044 11/19/22 2203 11/20/22 0413  BP: (!) 135/91 (!) 157/117 (!) 137/90 (!) 139/90  Pulse: 97 79  (!) 102  Resp: 18 19  20   Temp: 98.9 F (37.2 C) 99.1 F (37.3 C)  98.4 F (36.9 C)  TempSrc: Oral Oral  Oral  SpO2: 100% 97%  96%  Weight:      Height:        Intake/Output Summary (Last 24 hours) at 11/20/2022 1341 Last data filed  at 11/19/2022 1621 Gross per 24 hour  Intake --  Output 900 ml  Net -900 ml    Filed Weights   11/18/22 0034  Weight: 68.4 kg    Examination:  General exam: NAD Respiratory system: CTA Cardiovascular system: S 1, S 2 RRR Gastrointestinal system: BS present, soft, nt Central nervous system: alert, conversant.     Data Reviewed: I have personally reviewed following labs and imaging studies  CBC: Recent Labs  Lab 11/17/22 1817 11/17/22 1952 11/18/22 0441 11/20/22 0024  WBC 9.1  --  9.3 7.6  HGB 15.2* 14.6  12.9 14.0  HCT 44.5 43.0 37.1 42.1  MCV 87.1  --  87.3 90.3  PLT 221  --  206 200    Basic Metabolic Panel: Recent Labs  Lab 11/19/22 0826 11/19/22 1141 11/19/22 1535 11/19/22 2013 11/20/22 0024  NA 133* 133* 132* 135 133*  K 2.8* 2.9* 3.4* 3.4* 3.4*  CL 103 103 103 105 105  CO2 16* 17* 19* 18* 17*  GLUCOSE 163* 154* 145* 117* 147*  BUN 5* <5* <5* <5* <5*  CREATININE 0.67 0.73 0.60 0.66 0.63  CALCIUM 8.0* 8.1* 8.0* 8.4* 8.2*    GFR: Estimated Creatinine Clearance: 100.4 mL/min (by C-G formula based on SCr of 0.63 mg/dL). Liver Function Tests: Recent Labs  Lab 11/17/22 1817  AST 29  ALT 31  ALKPHOS 60  BILITOT 0.7  PROT 8.0  ALBUMIN 5.3*    Recent Labs  Lab 11/17/22 1817  LIPASE 32    No results for input(s): "AMMONIA" in the last 168 hours. Coagulation Profile: No results for input(s): "INR", "PROTIME" in the last 168 hours. Cardiac Enzymes: No results for input(s): "CKTOTAL", "CKMB", "CKMBINDEX", "TROPONINI" in the last 168 hours. BNP (last 3 results) No results for input(s): "PROBNP" in the last 8760 hours. HbA1C: Recent Labs    11/18/22 0439  HGBA1C 9.3*    CBG: Recent Labs  Lab 11/19/22 2042 11/20/22 0028 11/20/22 0411 11/20/22 0733 11/20/22 1133  GLUCAP 119* 159* 119* 155* 162*    Lipid Profile: No results for input(s): "CHOL", "HDL", "LDLCALC", "TRIG", "CHOLHDL", "LDLDIRECT" in the last 72 hours. Thyroid Function Tests: No results for input(s): "TSH", "T4TOTAL", "FREET4", "T3FREE", "THYROIDAB" in the last 72 hours. Anemia Panel: No results for input(s): "VITAMINB12", "FOLATE", "FERRITIN", "TIBC", "IRON", "RETICCTPCT" in the last 72 hours. Sepsis Labs: Recent Labs  Lab 11/19/22 1141 11/19/22 1332  LATICACIDVEN 2.4* 1.8    No results found for this or any previous visit (from the past 240 hour(s)).       Radiology Studies: DG CHEST PORT 1 VIEW  Result Date: 11/19/2022 CLINICAL DATA:  Lactic acidosis. Diabetes and  hypertension. Left-sided breast augmentation. EXAM: PORTABLE CHEST 1 VIEW COMPARISON:  None Available. FINDINGS: Numerous leads and wires project over the chest. Midline trachea. Normal heart size and mediastinal contours. No pleural effusion or pneumothorax. Clear lungs. Increased density projecting over the inferior left hemithorax, consistent with unilateral breast augmentation. IMPRESSION: No acute cardiopulmonary disease. Electronically Signed   By: Jeronimo Greaves M.D.   On: 11/19/2022 15:09        Scheduled Meds:  amphetamine-dextroamphetamine  20 mg Oral Q24H   amphetamine-dextroamphetamine  10 mg Oral Q24H   insulin aspart  0-15 Units Subcutaneous Q4H   insulin glargine-yfgn  5 Units Subcutaneous Daily   metoCLOPramide (REGLAN) injection  5 mg Intravenous Q6H   pantoprazole (PROTONIX) IV  40 mg Intravenous Q12H   QUEtiapine  25 mg Oral Once per  day on Fri Sat   risperiDONE  4 mg Oral BID   traZODone  50 mg Oral Once per day on Sun Mon Tue Wed Thu   Continuous Infusions:  0.9 % NaCl with KCl 20 mEq / L 100 mL/hr at 11/20/22 1320   promethazine (PHENERGAN) injection (IM or IVPB) 6.25 mg (11/19/22 1942)     LOS: 2 days    Time spent: 35 minutes    Estanislado Surgeon A Bertin Inabinet, MD [facility]   If 7PM-7AM, please contact night-coverage [email or web address]  11/20/2022, 1:41 PM

## 2022-11-21 DIAGNOSIS — R112 Nausea with vomiting, unspecified: Secondary | ICD-10-CM | POA: Diagnosis not present

## 2022-11-21 LAB — BASIC METABOLIC PANEL
Anion gap: 12 (ref 5–15)
Anion gap: 10 (ref 5–15)
Anion gap: 10 (ref 5–15)
Anion gap: 10 (ref 5–15)
Anion gap: 9 (ref 5–15)
BUN: <5 mg/dL — ABNORMAL LOW (ref 6–20)
BUN: <5 mg/dL — ABNORMAL LOW (ref 6–20)
BUN: <5 mg/dL — ABNORMAL LOW (ref 6–20)
BUN: <5 mg/dL — ABNORMAL LOW (ref 6–20)
BUN: <5 mg/dL — ABNORMAL LOW (ref 6–20)
CO2: 17 mmol/L — ABNORMAL LOW (ref 22–32)
CO2: 19 mmol/L — ABNORMAL LOW (ref 22–32)
CO2: 19 mmol/L — ABNORMAL LOW (ref 22–32)
CO2: 21 mmol/L — ABNORMAL LOW (ref 22–32)
CO2: 22 mmol/L (ref 22–32)
Calcium: 8.3 mg/dL — ABNORMAL LOW (ref 8.9–10.3)
Calcium: 8.2 mg/dL — ABNORMAL LOW (ref 8.9–10.3)
Calcium: 8.4 mg/dL — ABNORMAL LOW (ref 8.9–10.3)
Calcium: 8.6 mg/dL — ABNORMAL LOW (ref 8.9–10.3)
Calcium: 8.7 mg/dL — ABNORMAL LOW (ref 8.9–10.3)
Chloride: 102 mmol/L (ref 98–111)
Chloride: 104 mmol/L (ref 98–111)
Chloride: 101 mmol/L (ref 98–111)
Chloride: 103 mmol/L (ref 98–111)
Chloride: 103 mmol/L (ref 98–111)
Creatinine, Ser: 0.76 mg/dL (ref 0.44–1.00)
Creatinine, Ser: 0.78 mg/dL (ref 0.44–1.00)
Creatinine, Ser: 0.71 mg/dL (ref 0.44–1.00)
Creatinine, Ser: 0.7 mg/dL (ref 0.44–1.00)
Creatinine, Ser: 0.7 mg/dL (ref 0.44–1.00)
GFR, Estimated: >60 mL/min (ref >60)
GFR, Estimated: >60 mL/min (ref >60)
GFR, Estimated: >60 mL/min (ref >60)
GFR, Estimated: >60 mL/min (ref >60)
GFR, Estimated: >60 mL/min (ref >60)
Glucose, Bld: 179 mg/dL — ABNORMAL HIGH (ref 70–99)
Glucose, Bld: 191 mg/dL — ABNORMAL HIGH (ref 70–99)
Glucose, Bld: 196 mg/dL — ABNORMAL HIGH (ref 70–99)
Glucose, Bld: 152 mg/dL — ABNORMAL HIGH (ref 70–99)
Glucose, Bld: 140 mg/dL — ABNORMAL HIGH (ref 70–99)
Potassium: 3.3 mmol/L — ABNORMAL LOW (ref 3.5–5.1)
Potassium: 3.2 mmol/L — ABNORMAL LOW (ref 3.5–5.1)
Potassium: 3.5 mmol/L (ref 3.5–5.1)
Potassium: 3.2 mmol/L — ABNORMAL LOW (ref 3.5–5.1)
Potassium: 3.5 mmol/L (ref 3.5–5.1)
Sodium: 131 mmol/L — ABNORMAL LOW (ref 135–145)
Sodium: 133 mmol/L — ABNORMAL LOW (ref 135–145)
Sodium: 130 mmol/L — ABNORMAL LOW (ref 135–145)
Sodium: 134 mmol/L — ABNORMAL LOW (ref 135–145)
Sodium: 134 mmol/L — ABNORMAL LOW (ref 135–145)

## 2022-11-21 LAB — GLUCOSE, CAPILLARY
Glucose-Capillary: 119 mg/dL — ABNORMAL HIGH (ref 70–99)
Glucose-Capillary: 124 mg/dL — ABNORMAL HIGH (ref 70–99)
Glucose-Capillary: 125 mg/dL — ABNORMAL HIGH (ref 70–99)
Glucose-Capillary: 129 mg/dL — ABNORMAL HIGH (ref 70–99)
Glucose-Capillary: 139 mg/dL — ABNORMAL HIGH (ref 70–99)
Glucose-Capillary: 140 mg/dL — ABNORMAL HIGH (ref 70–99)
Glucose-Capillary: 156 mg/dL — ABNORMAL HIGH (ref 70–99)
Glucose-Capillary: 163 mg/dL — ABNORMAL HIGH (ref 70–99)
Glucose-Capillary: 166 mg/dL — ABNORMAL HIGH (ref 70–99)
Glucose-Capillary: 166 mg/dL — ABNORMAL HIGH (ref 70–99)
Glucose-Capillary: 170 mg/dL — ABNORMAL HIGH (ref 70–99)
Glucose-Capillary: 171 mg/dL — ABNORMAL HIGH (ref 70–99)
Glucose-Capillary: 176 mg/dL — ABNORMAL HIGH (ref 70–99)
Glucose-Capillary: 178 mg/dL — ABNORMAL HIGH (ref 70–99)
Glucose-Capillary: 178 mg/dL — ABNORMAL HIGH (ref 70–99)
Glucose-Capillary: 186 mg/dL — ABNORMAL HIGH (ref 70–99)
Glucose-Capillary: 187 mg/dL — ABNORMAL HIGH (ref 70–99)
Glucose-Capillary: 195 mg/dL — ABNORMAL HIGH (ref 70–99)
Glucose-Capillary: 201 mg/dL — ABNORMAL HIGH (ref 70–99)

## 2022-11-21 LAB — BETA-HYDROXYBUTYRIC ACID
Beta-Hydroxybutyric Acid: 2.87 mmol/L — ABNORMAL HIGH (ref 0.05–0.27)
Beta-Hydroxybutyric Acid: 1.49 mmol/L — ABNORMAL HIGH (ref 0.05–0.27)

## 2022-11-21 LAB — MAGNESIUM: Magnesium: 2.2 mg/dL (ref 1.7–2.4)

## 2022-11-21 MED ORDER — INSULIN REGULAR(HUMAN) IN NACL 100-0.9 UT/100ML-% IV SOLN: INTRAVENOUS | Status: DC

## 2022-11-21 MED ORDER — ACETAMINOPHEN 325 MG PO TABS: 650.0000 mg | ORAL_TABLET | Freq: Four times a day (QID) | ORAL | Status: DC | PRN

## 2022-11-21 MED ORDER — POTASSIUM CHLORIDE 10 MEQ/100ML IV SOLN
10.0000 meq | INTRAVENOUS | Status: AC
  Administered 2022-11-21 (×2): 10 meq via INTRAVENOUS
  Filled 2022-11-21 (×2): qty 100

## 2022-11-21 MED ORDER — ATENOLOL 25 MG PO TABS
50.0000 mg | ORAL_TABLET | Freq: Every day | ORAL | Status: DC
  Administered 2022-11-21: 50 mg via ORAL
  Filled 2022-11-21: qty 2

## 2022-11-21 MED ORDER — INSULIN DETEMIR 100 UNIT/ML ~~LOC~~ SOLN
15.0000 [IU] | Freq: Every day | SUBCUTANEOUS | Status: DC
  Administered 2022-11-21 – 2022-11-22 (×2): 15 [IU] via SUBCUTANEOUS
  Filled 2022-11-21 (×2): qty 0.15

## 2022-11-21 MED ORDER — SODIUM CHLORIDE 0.9 % IV SOLN: INTRAVENOUS | Status: DC

## 2022-11-21 MED ORDER — INSULIN ASPART 100 UNIT/ML IJ SOLN: 0.0000 [IU] | Freq: Every day | INTRAMUSCULAR | Status: DC

## 2022-11-21 MED ORDER — QUETIAPINE FUMARATE 50 MG PO TABS
25.0000 mg | ORAL_TABLET | Freq: Once | ORAL | Status: AC
  Administered 2022-11-21: 25 mg via ORAL
  Filled 2022-11-21: qty 1

## 2022-11-21 MED ORDER — POTASSIUM CHLORIDE 10 MEQ/100ML IV SOLN
10.0000 meq | INTRAVENOUS | Status: AC
  Administered 2022-11-21: 10 meq via INTRAVENOUS
  Filled 2022-11-21: qty 100

## 2022-11-21 MED ORDER — INSULIN ASPART 100 UNIT/ML IJ SOLN
0.0000 [IU] | Freq: Three times a day (TID) | INTRAMUSCULAR | Status: DC
  Administered 2022-11-22: 2 [IU] via SUBCUTANEOUS

## 2022-11-21 MED ORDER — POTASSIUM CHLORIDE 10 MEQ/100ML IV SOLN
10.0000 meq | INTRAVENOUS | Status: AC
  Administered 2022-11-21 (×3): 10 meq via INTRAVENOUS
  Filled 2022-11-21 (×3): qty 100

## 2022-11-21 MED ORDER — ORAL CARE MOUTH RINSE: 15.0000 mL | OROMUCOSAL | Status: DC | PRN

## 2022-11-21 NOTE — Progress Notes (Signed)
PROGRESS NOTE    Alyssa Sanford  UJW:[id number] DOB: 1996/01/13 DOA: 11/17/2022 PCP: Moshe Cipro, NP   Brief Narrative: 27 year old with past medical history significant for diabetes type 2 hypertension, bipolar disorder presents with intractable nausea and vomiting.  He denies abdominal pain dysuria.  Marijuana.  Evaluation in the ED CBG 263 anion gap 18, beta hydroxy 2.4.  Patient admitted with DKA, slow to improved. She was started on insulin gtt 5/19-- Beta-hydroxybutyric acid decreasing, symptoms improved today so far.    Assessment & Plan:   Principal Problem:   Intractable nausea and vomiting Active Problems:   DKA (diabetic ketoacidosis) (HCC)   HTN (hypertension)   Bipolar 1 disorder (HCC)  1-Early DKA: -Presents with metabolic acidosis, elevated anion gap at 18, hyperglycemia, Bicarb at 17. Initially started  Low dose Semglee, SSI.  -Continue with IV fluids.  -5/19: Gap was closed, but continue to have low bicarb, elevated Beta hydroxybutyric acid level and vomiting. No hyperglycemia-- started on insulin gtt and D 5 on 5/19. -5/20; Bicarb increasing at 21 , gap continue to be closed, Beta-hydroxybutyric acid decreased to 1.4 Symptoms improving.  Hopefully transition to long acting insulin later today if labs continue to improved.    2-Nausea, vomiting. In setting DKA, Diabetes Gastroparesis, and Marihuana use.  Started Schedule Reglan 5/17 PRN Phenergan.  Support care.  GI consulted, symptoms multifactorial DKA, Diabetes gastroparesis.  CT abdomen pelvis negative for infection or acute finding.  CT head Normal.   3-Hypokalemia; replete IV kcl.   4-ADHD; on  Adderall.   5-Bipolar; Resume Seroquel, Risperdal, trazodone.   She takes risperidone 8 mg at bed time. She doesn't take BID>  Will change dose starting today  HTN; PRN labetalol. Resume atenolol.   Metabolic Acidosis. Suspect is now related to hypovolemia, vomiting and DKA Lactic acid 2.4.  related to hypovolemia. Resolved with Fluids. CT abdomen pelvis negative for infection CT abdomen pelvis negative.   Moderate diffuse hepatic steatosis again noted. Unchanged 3.1 cm RIGHT hepatic focal nodular hyperplasia. -Needs follow up out patient.   Estimated body mass index is 25.35 kg/m as calculated from the following:   Height as of this encounter: 5\' 4"  (1.626 m).   Weight as of this encounter: 67 kg.   DVT prophylaxis: SCD Code Status: Full code Family Communication: Care discussed with patient and parents at bedside.  Disposition Plan:  Status is: Observation The patient will require care spanning > 2 midnights and should be moved to inpatient because: management of vomiting.     Consultants:  None  Procedures:    Antimicrobials:    Subjective: She feels better today. She has been able to tolerate some breakfast and some lunch. Only nausea feeling is from hunger.  We discussed about importance of not using marihuana and good diabetes controlled and follow carb modified diet. She is very receptive to recommendations.   Objective: Vitals:   11/21/22 0644 11/21/22 0800 11/21/22 0900 11/21/22 1200  BP: (!) 151/105  (!) 152/103   Pulse: (!) 112  (!) 111   Resp: 20  15   Temp:  98.5 F (36.9 C)  98.2 F (36.8 C)  TempSrc:  Oral  Oral  SpO2: 96%  96%   Weight:      Height:        Intake/Output Summary (Last 24 hours) at 11/21/2022 1400 Last data filed at 11/21/2022 1300 Gross per 24 hour  Intake 2805.95 ml  Output 2600 ml  Net 205.95 ml  Filed Weights   11/18/22 0034 11/20/22 1700  Weight: 68.4 kg 67 kg    Examination:  General exam: NAD Respiratory system: CTA Cardiovascular system: S 1, S 2 RRR Gastrointestinal system: BS present, soft, nt Central nervous system: alert, follows command.     Data Reviewed: I have personally reviewed following labs and imaging studies  CBC: Recent Labs  Lab 11/17/22 1817 11/17/22 1952 11/18/22 0441  11/20/22 0024  WBC 9.1  --  9.3 7.6  HGB 15.2* 14.6 12.9 14.0  HCT 44.5 43.0 37.1 42.1  MCV 87.1  --  87.3 90.3  PLT 221  --  206 200   Basic Metabolic Panel: Recent Labs  Lab 11/20/22 1934 11/21/22 0129 11/21/22 0321 11/21/22 0718 11/21/22 1200  NA 132* 131* 133* 130* 134*  K 3.6 3.3* 3.2* 3.5 3.2*  CL 102 102 104 101 103  CO2 18* 17* 19* 19* 21*  GLUCOSE 171* 179* 191* 196* 152*  BUN <5* <5* <5* <5* <5*  CREATININE 0.67 0.76 0.78 0.71 0.70  CALCIUM 8.4* 8.3* 8.2* 8.4* 8.6*  MG  --   --  2.2  --   --    GFR: Estimated Creatinine Clearance: 99.4 mL/min (by C-G formula based on SCr of 0.7 mg/dL). Liver Function Tests: Recent Labs  Lab 11/17/22 1817 11/20/22 1554  AST 29 29  ALT 31 28  ALKPHOS 60 50  BILITOT 0.7 1.2  PROT 8.0 7.0  ALBUMIN 5.3* 4.0   Recent Labs  Lab 11/17/22 1817 11/20/22 1554  LIPASE 32 55*   No results for input(s): "AMMONIA" in the last 168 hours. Coagulation Profile: No results for input(s): "INR", "PROTIME" in the last 168 hours. Cardiac Enzymes: No results for input(s): "CKTOTAL", "CKMB", "CKMBINDEX", "TROPONINI" in the last 168 hours. BNP (last 3 results) No results for input(s): "PROBNP" in the last 8760 hours. HbA1C: No results for input(s): "HGBA1C" in the last 72 hours.  CBG: Recent Labs  Lab 11/21/22 0748 11/21/22 0905 11/21/22 1017 11/21/22 1200 11/21/22 1302  GLUCAP 176* 156* 124* 139* 186*   Lipid Profile: No results for input(s): "CHOL", "HDL", "LDLCALC", "TRIG", "CHOLHDL", "LDLDIRECT" in the last 72 hours. Thyroid Function Tests: No results for input(s): "TSH", "T4TOTAL", "FREET4", "T3FREE", "THYROIDAB" in the last 72 hours. Anemia Panel: No results for input(s): "VITAMINB12", "FOLATE", "FERRITIN", "TIBC", "IRON", "RETICCTPCT" in the last 72 hours. Sepsis Labs: Recent Labs  Lab 11/19/22 1141 11/19/22 1332  LATICACIDVEN 2.4* 1.8    No results found for this or any previous visit (from the past 240 hour(s)).        Radiology Studies: CT HEAD WO CONTRAST ( )  Result Date: 11/20/2022 CLINICAL DATA:  Initial evaluation for altered mental status, intractable nausea and vomiting. EXAM: CT HEAD WITHOUT CONTRAST TECHNIQUE: Contiguous axial images were obtained from the base of the skull through the vertex without intravenous contrast. RADIATION DOSE REDUCTION: This exam was performed according to the departmental dose-optimization program which includes automated exposure control, adjustment of the mA and/or kV according to patient size and/or use of iterative reconstruction technique. COMPARISON:  None Available. FINDINGS: Brain: Cerebral volume within normal limits for patient age. No evidence for acute intracranial hemorrhage. No findings to suggest acute large vessel territory infarct. No mass lesion, midline shift, or mass effect. Ventricles are normal in size without evidence for hydrocephalus. No extra-axial fluid collection identified. Vascular: No hyperdense vessel identified. Skull: Scalp soft tissues demonstrate no acute abnormality. Calvarium intact. Sinuses/Orbits: Globes and orbital soft tissues within normal  limits. Visualized paranasal sinuses are clear. No mastoid effusion. IMPRESSION: Normal head CT. No acute intracranial abnormality identified. Electronically Signed   By: Rise Mu M.D.   On: 11/20/2022 21:57   CT ABDOMEN PELVIS W CONTRAST  Result Date: 11/20/2022 CLINICAL DATA:  27 year old female with intractable vomiting. EXAM: CT ABDOMEN AND PELVIS WITH CONTRAST TECHNIQUE: Multidetector CT imaging of the abdomen and pelvis was performed using the standard protocol following bolus administration of intravenous contrast. RADIATION DOSE REDUCTION: This exam was performed according to the departmental dose-optimization program which includes automated exposure control, adjustment of the mA and/or kV according to patient size and/or use of iterative reconstruction technique. CONTRAST:   OMNIPAQUE IOHEXOL 300 MG/ML  SOLN COMPARISON:  07/07/2022 MR, 07/01/2022 ultrasound. FINDINGS: Lower chest: No acute abnormality. Portion of a LEFT breast implant noted. Hepatobiliary: Moderate diffuse hepatic steatosis again identified. A 3.1 cm enhancing mass within the RIGHT liver (images 14-18: Series 3) is unchanged and characterized as focal nodular hyperplasia on 07/07/2022 abdominal MR. The gallbladder is unremarkable. There is no evidence of intrahepatic or extrahepatic biliary dilatation. Pancreas: Unremarkable Spleen: Unremarkable Adrenals/Urinary Tract: The kidneys, adrenal glands and bladder are unremarkable. Stomach/Bowel: Stomach is within normal limits. Appendix appears normal. No evidence of bowel wall thickening, distention, or inflammatory changes. Vascular/Lymphatic: No significant vascular findings are present. No enlarged abdominal or pelvic lymph nodes. Reproductive: Uterus and bilateral adnexa are unremarkable. Other: No ascites, focal collection or pneumoperitoneum. Musculoskeletal: No acute or suspicious bony abnormalities are noted. IMPRESSION: 1. No evidence of acute abnormality. 2. Moderate diffuse hepatic steatosis again noted. 3. Unchanged 3.1 cm RIGHT hepatic focal nodular hyperplasia. Electronically Signed   By: Harmon Pier M.D.   On: 11/20/2022 13:50        Scheduled Meds:  amphetamine-dextroamphetamine  20 mg Oral Q24H   amphetamine-dextroamphetamine  10 mg Oral Q24H   atenolol  50 mg Oral Daily   Chlorhexidine Gluconate Cloth  6 each Topical Daily   metoCLOPramide (REGLAN) injection  5 mg Intravenous Q6H   ondansetron (ZOFRAN) IV  4 mg Intravenous Q8H   pantoprazole (PROTONIX) IV  40 mg Intravenous Q12H   QUEtiapine  25 mg Oral Once per day on Fri Sat   risperiDONE  8 mg Oral QHS   traZODone  50 mg Oral Once per day on Sun Mon Tue Wed Thu   Continuous Infusions:  dextrose 5 % and 0.9% NaCl 125 mL/hr at 11/21/22 1037   insulin 4.8 Units/hr (11/21/22 1304)    potassium chloride     promethazine (PHENERGAN) injection (IM or IVPB) Stopped (11/21/22 1005)     LOS: 3 days    Time spent: 35 minutes    Lakeithia Rasor A Nicko Daher, MD [facility]   If 7PM-7AM, please contact night-coverage [email or web address]  11/21/2022, 2:00 PM

## 2022-11-22 DIAGNOSIS — R112 Nausea with vomiting, unspecified: Secondary | ICD-10-CM | POA: Diagnosis not present

## 2022-11-22 LAB — BASIC METABOLIC PANEL
Anion gap: 10 (ref 5–15)
BUN: 7 mg/dL (ref 6–20)
CO2: 19 mmol/L — ABNORMAL LOW (ref 22–32)
Calcium: 8.7 mg/dL — ABNORMAL LOW (ref 8.9–10.3)
Chloride: 105 mmol/L (ref 98–111)
Creatinine, Ser: 0.83 mg/dL (ref 0.44–1.00)
GFR, Estimated: >60 mL/min (ref >60)
Glucose, Bld: 140 mg/dL — ABNORMAL HIGH (ref 70–99)
Potassium: 3.7 mmol/L (ref 3.5–5.1)
Sodium: 134 mmol/L — ABNORMAL LOW (ref 135–145)

## 2022-11-22 LAB — GLUCOSE, CAPILLARY: Glucose-Capillary: 150 mg/dL — ABNORMAL HIGH (ref 70–99)

## 2022-11-22 MED ORDER — METOCLOPRAMIDE HCL 5 MG PO TABS
5.0000 mg | ORAL_TABLET | Freq: Three times a day (TID) | ORAL | Status: DC
  Administered 2022-11-22: 5 mg via ORAL
  Filled 2022-11-22: qty 1

## 2022-11-22 MED ORDER — LANCET DEVICE MISC: 1.0000 | Freq: Three times a day (TID) | 0 refills | Status: AC

## 2022-11-22 MED ORDER — ATENOLOL 100 MG PO TABS
100.0000 mg | ORAL_TABLET | Freq: Every day | ORAL | Status: DC
  Administered 2022-11-22: 100 mg via ORAL
  Filled 2022-11-22: qty 1

## 2022-11-22 MED ORDER — INSULIN GLARGINE 100 UNIT/ML SOLOSTAR PEN: 15.0000 [IU] | PEN_INJECTOR | Freq: Every day | SUBCUTANEOUS | 11 refills | Status: DC

## 2022-11-22 MED ORDER — METOCLOPRAMIDE HCL 5 MG PO TABS: 5.0000 mg | ORAL_TABLET | Freq: Three times a day (TID) | ORAL | 0 refills | Status: DC

## 2022-11-22 MED ORDER — BLOOD GLUCOSE TEST VI STRP: 1.0000 | ORAL_STRIP | Freq: Three times a day (TID) | 0 refills | Status: AC

## 2022-11-22 MED ORDER — LANCETS MISC. MISC: 1.0000 | Freq: Three times a day (TID) | 0 refills | Status: AC

## 2022-11-22 MED ORDER — "PEN NEEDLES 3/16"" 31G X 5 MM MISC": 1.0000 | Freq: Every day | 11 refills | Status: DC

## 2022-11-22 MED ORDER — BLOOD GLUCOSE MONITORING SUPPL DEVI: 1.0000 | Freq: Three times a day (TID) | 0 refills | Status: AC

## 2022-11-22 MED ORDER — AMLODIPINE BESYLATE 5 MG PO TABS
2.5000 mg | ORAL_TABLET | Freq: Every day | ORAL | Status: DC
  Administered 2022-11-22: 2.5 mg via ORAL
  Filled 2022-11-22: qty 1

## 2022-11-22 MED ORDER — ONDANSETRON HCL 4 MG PO TABS: 4.0000 mg | ORAL_TABLET | Freq: Three times a day (TID) | ORAL | 0 refills | Status: AC | PRN

## 2022-11-22 MED ORDER — INSULIN LISPRO (1 UNIT DIAL) 100 UNIT/ML (KWIKPEN): 3.0000 [IU] | PEN_INJECTOR | Freq: Three times a day (TID) | SUBCUTANEOUS | 11 refills | Status: DC

## 2022-11-22 NOTE — Progress Notes (Signed)
Patient provided with discharge paperwork, and all questions answered. RD to call patient and mother as requested. All lines d/c. Patient currently awaiting transport. Care complete.

## 2022-11-22 NOTE — Inpatient Diabetes Management (Signed)
Inpatient Diabetes Program Recommendations  AACE/ADA: New Consensus Statement on Inpatient Glycemic Control (2015)  Target Ranges:  Prepandial:   less than 140 mg/dL      Peak postprandial:   less than 180 mg/dL (1-2 hours)      Critically ill patients:  140 - 180 mg/dL   Lab Results  Component Value Date   GLUCAP 150 (H) 11/22/2022   HGBA1C 9.3 (H) 11/18/2022    Review of Glycemic Control  Diabetes history: DM2 Outpatient Diabetes medications: Ozempic 0.5 mg weekly Current orders for Inpatient glycemic control: Levemir 15 QD, Novolog 0-15 TID with meals and 0-5 HS  HgbA1C 9.3%  Inpatient Diabetes Program Recommendations:    For discharge:  Lantus Solostar 15 units QD Humalog 3 units TID with meals  Would not restart Ozempic.  Educated patient on insulin pen use at home. Ordered contents of insulin flexpen starter kit. Reviewed all steps if insulin pen including attachment of needle, 2-unit air shot, dialing up dose, giving injection, removing needle, disposal of sharps, storage of unused insulin, disposal of insulin etc. Patient able to provide successful return demonstration. Also reviewed troubleshooting with insulin pen. MD to give patient Rxs for insulin pens and insulin pen needles.  Reviewed survival skills for diabetes, starting insulin. Reviewed signs and symptoms of hyperglycemia and hypoglycemia along with treatment for both. Discussed impact of nutrition, exercise, stress, sickness, and medications on diabetes control. Reviewed Living Well with diabetes booklet and encouraged patient to read through entire book. Discussed Libre CGM and pt would like to read about it prior to making decision. Will talk with her PCP. Answered all questions.   Thank you. Ailene Ards, RD, LDN, CDCES Inpatient Diabetes Coordinator [phone]

## 2022-11-22 NOTE — Progress Notes (Signed)
Nutrition Diet Education Note  RD consulted for nutrition education regarding diabetes. Comment of "Please see patient and talk to Mother about carb modified diet"  Lab Results  Component Value Date   HGBA1C 9.3 (H) 11/18/2022   Patient was about to leave room for discharge at time of visit. Able to quickly speak with patient.  Food recall as below: Breakfast: coffee with heavy cream Lunch: Lean Cuisine (varies, often pasta) Dinner: Curator  Snacks: late at night, unable to provide example Beverages: water, coffee, diet ginger ale   RD provided "Carbohydrate Counting for People with Diabetes", "Plate Method for Diabetes" and "Using Nutrition Labels: Carbohydrates" handouts from the Academy of [employer] and Dietetics. Briefly discussed different food groups and their effects on blood sugar, emphasizing carbohydrate-containing foods.  Patient seemed disinterested in diet education. Asked RD to call her mother and answer her specific questions.  Placed call to Mom with number in chart per MD and patient request. Mom reports she is the grocery shopper and cook in the household. She had questions about choosing best whole gain pasta choices, starchy vs non-starchy vegetables, and good snacks. Addressed all questions. Mom reports she plans to start meal prepping for patient to provide her with healthier but still convenient meals. Also plans to replace less healthy snacks with healthier snacks for patient to have on hand. Informed mom diet educations were provided to patient at discharge. No other concerns.  Patient now discharged.   Shelle Iron RD, LDN For contact information, refer to [facility].

## 2022-11-22 NOTE — Discharge Summary (Signed)
Physician Discharge Summary   Patient: Alyssa Sanford MRN: [id number] DOB: 01/16/1996  Admit date:     11/17/2022  Discharge date: 11/22/22  Discharge Physician: Alba Cory   PCP: Moshe Cipro, NP   Recommendations at discharge:   Needs referral to endocrinologist Osempic has been discontinue.    Discharge Diagnoses: Principal Problem:   Intractable nausea and vomiting Active Problems:   DKA (diabetic ketoacidosis) (HCC)   HTN (hypertension)   Bipolar 1 disorder (HCC)  Resolved Problems:   * No resolved hospital problems. *  Hospital Course: 27 year old with past medical history significant for diabetes type 2 hypertension, bipolar disorder presents with intractable nausea and vomiting.  He denies abdominal pain dysuria.  Marijuana.  Evaluation in the ED CBG 263 anion gap 18, beta hydroxy 2.4.   Patient admitted with DKA, slow to improved. She was started on insulin gtt 5/19-- Beta-hydroxybutyric acid decreasing, symptoms improved today so far.   Assessment and Plan: 1-Early DKA: -Presents with metabolic acidosis, elevated anion gap at 18, hyperglycemia, Bicarb at 17. Initially started  Low dose Semglee, SSI.  -Continue with IV fluids.  -5/19: Gap was closed, but continue to have low bicarb, elevated Beta hydroxybutyric acid level and vomiting. No hyperglycemia-- started on insulin gtt and D 5 on 5/19. -5/20; Bicarb increasing at 21 , gap continue to be closed, Beta-hydroxybutyric acid decreased to 1.4 Symptoms improving.  No further vomiting. She will be discharge on Lantus 15 units and Humalog 3 units with meals.      2-Nausea, vomiting. In setting DKA, Diabetes Gastroparesis, and Marihuana use.  Started Schedule Reglan 5/17 PRN Phenergan.  Support care.  GI consulted, symptoms multifactorial DKA, Diabetes gastroparesis.  CT abdomen pelvis negative for infection or acute finding.  CT head Normal.  Symptoms resolved after she was treated with insulin  gtt.   3-Hypokalemia; Replaced.    4-ADHD; on  Adderall.    5-Bipolar; Resume Seroquel, Risperdal, trazodone.   She takes risperidone 8 mg at bed time.   HTN; PRN labetalol. Resume atenolol Norvasc.    Metabolic Acidosis. Suspect  related to hypovolemia, vomiting and DKA Lactic acid 2.4. related to hypovolemia. Resolved with Fluids. CT abdomen pelvis negative for infection CT abdomen pelvis negative.    Moderate diffuse hepatic steatosis again noted. Unchanged 3.1 cm RIGHT hepatic focal nodular hyperplasia. -Needs follow up out patient.          Consultants: GI Procedures performed: None Disposition: Home Diet recommendation:  Discharge Diet Orders (From admission, onward)     Start     Ordered   11/22/22 0000  Diet Carb Modified        11/22/22 0929           Carb modified diet DISCHARGE MEDICATION: Allergies as of 11/22/2022       Reactions   Amoxicillin    Penicillins         Medication List     STOP taking these medications    famotidine 40 MG tablet Commonly known as: PEPCID   ondansetron 4 MG disintegrating tablet Commonly known as: ZOFRAN-ODT Replaced by: ondansetron 4 MG tablet   Ozempic (2 MG/DOSE) 8 MG/3ML Sopn Generic drug: Semaglutide (2 MG/DOSE)   pantoprazole 20 MG tablet Commonly known as: PROTONIX       TAKE these medications    amLODipine 2.5 MG tablet Commonly known as: NORVASC Take 2.5 mg by mouth daily.   amphetamine-dextroamphetamine 25 MG 24 hr capsule Commonly known as: ADDERALL XR  Take 25 mg by mouth every morning. What changed: Another medication with the same name was removed. Continue taking this medication, and follow the directions you see here.   amphetamine-dextroamphetamine 10 MG 24 hr capsule Commonly known as: ADDERALL XR Take 10 mg by mouth daily as needed (in afternoon for attention.). What changed: Another medication with the same name was removed. Continue taking this medication, and follow the  directions you see here.   atenolol 100 MG tablet Commonly known as: TENORMIN Take 100 mg by mouth daily.   Blood Glucose Monitoring Suppl Devi 1 each by Does not apply route in the morning, at noon, and at bedtime. May substitute to any manufacturer covered by patient's insurance.   BLOOD GLUCOSE TEST STRIPS Strp 1 each by In Vitro route in the morning, at noon, and at bedtime. May substitute to any manufacturer covered by patient's insurance.   clonazePAM 0.5 MG tablet Commonly known as: KLONOPIN Take 0.5 mg by mouth 2 (two) times daily as needed for anxiety.   diphenhydrAMINE 25 MG tablet Commonly known as: SOMINEX Take 50 mg by mouth at bedtime as needed for itching, allergies or sleep.   insulin glargine 100 UNIT/ML Solostar Pen Commonly known as: LANTUS Inject 15 Units into the skin daily.   insulin lispro 100 UNIT/ML KwikPen Commonly known as: HUMALOG Inject 3 Units into the skin 3 (three) times daily.   Junel 1.5/30 1.5-30 MG-MCG tablet Generic drug: Norethindrone Acetate-Ethinyl Estradiol Take 1 tablet by mouth daily.   Lancet Device Misc 1 each by Does not apply route in the morning, at noon, and at bedtime. May substitute to any manufacturer covered by patient's insurance.   Lancets Misc. Misc 1 each by Does not apply route in the morning, at noon, and at bedtime. May substitute to any manufacturer covered by patient's insurance.   metoCLOPramide 5 MG tablet Commonly known as: REGLAN Take 1 tablet (5 mg total) by mouth 4 (four) times daily -  before meals and at bedtime for 20 days.   ondansetron 4 MG tablet Commonly known as: ZOFRAN Take 1 tablet (4 mg total) by mouth every 8 (eight) hours as needed for nausea or vomiting. Replaces: ondansetron 4 MG disintegrating tablet   Pen Needles 3/16" 31G X 5 MM Misc 1 Application by Does not apply route daily.   QUEtiapine 25 MG tablet Commonly known as: SEROQUEL Take 25 mg by mouth at bedtime.   risperidone 4  MG tablet Commonly known as: RISPERDAL Take 4 mg by mouth 2 (two) times daily.   traZODone 50 MG tablet Commonly known as: DESYREL Take 50-100 mg by mouth See admin instructions. Take 50-100 mg (1-2 tablets) by mouth daily on the weekdays.        Follow-up Information     Moshe Cipro, NP Follow up in 1 week(s).   Specialty: Family Medicine Contact information: [address] [place] [address] [phone]                Discharge Exam: Ceasar Mons Weights   11/18/22 0034 11/20/22 1700  Weight: 68.4 kg 67 kg   General; NAD  Condition at discharge: stable  The results of significant diagnostics from this hospitalization (including imaging, microbiology, ancillary and laboratory) are listed below for reference.   Imaging Studies: CT HEAD WO CONTRAST ( )  Result Date: 11/20/2022 CLINICAL DATA:  Initial evaluation for altered mental status, intractable nausea and vomiting. EXAM: CT HEAD WITHOUT CONTRAST TECHNIQUE: Contiguous axial images were obtained from the  base of the skull through the vertex without intravenous contrast. RADIATION DOSE REDUCTION: This exam was performed according to the departmental dose-optimization program which includes automated exposure control, adjustment of the mA and/or kV according to patient size and/or use of iterative reconstruction technique. COMPARISON:  None Available. FINDINGS: Brain: Cerebral volume within normal limits for patient age. No evidence for acute intracranial hemorrhage. No findings to suggest acute large vessel territory infarct. No mass lesion, midline shift, or mass effect. Ventricles are normal in size without evidence for hydrocephalus. No extra-axial fluid collection identified. Vascular: No hyperdense vessel identified. Skull: Scalp soft tissues demonstrate no acute abnormality. Calvarium intact. Sinuses/Orbits: Globes and orbital soft tissues within normal limits. Visualized paranasal sinuses are clear. No  mastoid effusion. IMPRESSION: Normal head CT. No acute intracranial abnormality identified. Electronically Signed   By: Rise Mu M.D.   On: 11/20/2022 21:57   CT ABDOMEN PELVIS W CONTRAST  Result Date: 11/20/2022 CLINICAL DATA:  27 year old female with intractable vomiting. EXAM: CT ABDOMEN AND PELVIS WITH CONTRAST TECHNIQUE: Multidetector CT imaging of the abdomen and pelvis was performed using the standard protocol following bolus administration of intravenous contrast. RADIATION DOSE REDUCTION: This exam was performed according to the departmental dose-optimization program which includes automated exposure control, adjustment of the mA and/or kV according to patient size and/or use of iterative reconstruction technique. CONTRAST:  OMNIPAQUE IOHEXOL 300 MG/ML  SOLN COMPARISON:  07/07/2022 MR, 07/01/2022 ultrasound. FINDINGS: Lower chest: No acute abnormality. Portion of a LEFT breast implant noted. Hepatobiliary: Moderate diffuse hepatic steatosis again identified. A 3.1 cm enhancing mass within the RIGHT liver (images 14-18: Series 3) is unchanged and characterized as focal nodular hyperplasia on 07/07/2022 abdominal MR. The gallbladder is unremarkable. There is no evidence of intrahepatic or extrahepatic biliary dilatation. Pancreas: Unremarkable Spleen: Unremarkable Adrenals/Urinary Tract: The kidneys, adrenal glands and bladder are unremarkable. Stomach/Bowel: Stomach is within normal limits. Appendix appears normal. No evidence of bowel wall thickening, distention, or inflammatory changes. Vascular/Lymphatic: No significant vascular findings are present. No enlarged abdominal or pelvic lymph nodes. Reproductive: Uterus and bilateral adnexa are unremarkable. Other: No ascites, focal collection or pneumoperitoneum. Musculoskeletal: No acute or suspicious bony abnormalities are noted. IMPRESSION: 1. No evidence of acute abnormality. 2. Moderate diffuse hepatic steatosis again noted. 3.  Unchanged 3.1 cm RIGHT hepatic focal nodular hyperplasia. Electronically Signed   By: Harmon Pier M.D.   On: 11/20/2022 13:50   DG CHEST PORT 1 VIEW  Result Date: 11/19/2022 CLINICAL DATA:  Lactic acidosis. Diabetes and hypertension. Left-sided breast augmentation. EXAM: PORTABLE CHEST 1 VIEW COMPARISON:  None Available. FINDINGS: Numerous leads and wires project over the chest. Midline trachea. Normal heart size and mediastinal contours. No pleural effusion or pneumothorax. Clear lungs. Increased density projecting over the inferior left hemithorax, consistent with unilateral breast augmentation. IMPRESSION: No acute cardiopulmonary disease. Electronically Signed   By: Jeronimo Greaves M.D.   On: 11/19/2022 15:09    Microbiology: No results found for this or any previous visit.  Labs: CBC: Recent Labs  Lab 11/17/22 1817 11/17/22 1952 11/18/22 0441 11/20/22 0024  WBC 9.1  --  9.3 7.6  HGB 15.2* 14.6 12.9 14.0  HCT 44.5 43.0 37.1 42.1  MCV 87.1  --  87.3 90.3  PLT 221  --  206 200   Basic Metabolic Panel: Recent Labs  Lab 11/21/22 0321 11/21/22 0718 11/21/22 1200 11/21/22 1545 11/22/22 0330  NA 133* 130* 134* 134* 134*  K 3.2* 3.5 3.2* 3.5 3.7  CL  104 101 103 103 105  CO2 19* 19* 21* 22 19*  GLUCOSE 191* 196* 152* 140* 140*  BUN <5* <5* <5* <5* 7  CREATININE 0.78 0.71 0.70 0.70 0.83  CALCIUM 8.2* 8.4* 8.6* 8.7* 8.7*  MG 2.2  --   --   --   --    Liver Function Tests: Recent Labs  Lab 11/17/22 1817 11/20/22 1554  AST 29 29  ALT 31 28  ALKPHOS 60 50  BILITOT 0.7 1.2  PROT 8.0 7.0  ALBUMIN 5.3* 4.0   CBG: Recent Labs  Lab 11/21/22 1819 11/21/22 1921 11/21/22 2019 11/21/22 2319 11/22/22 0802  GLUCAP 140* 166* 125* 178* 150*    Discharge time spent: greater than 30 minutes.  Signed: Alba Cory, MD [facility] 11/22/2022

## 2022-11-24 DIAGNOSIS — F4310 Post-traumatic stress disorder, unspecified: Secondary | ICD-10-CM | POA: Diagnosis not present

## 2022-11-24 DIAGNOSIS — E1165 Type 2 diabetes mellitus with hyperglycemia: Secondary | ICD-10-CM | POA: Diagnosis not present

## 2022-11-24 DIAGNOSIS — F840 Autistic disorder: Secondary | ICD-10-CM | POA: Diagnosis not present

## 2022-11-24 DIAGNOSIS — F3112 Bipolar disorder, current episode manic without psychotic features, moderate: Secondary | ICD-10-CM | POA: Diagnosis not present

## 2022-11-24 DIAGNOSIS — F902 Attention-deficit hyperactivity disorder, combined type: Secondary | ICD-10-CM | POA: Diagnosis not present

## 2022-11-29 DIAGNOSIS — R197 Diarrhea, unspecified: Secondary | ICD-10-CM | POA: Diagnosis not present

## 2022-11-29 DIAGNOSIS — R198 Other specified symptoms and signs involving the digestive system and abdomen: Secondary | ICD-10-CM | POA: Diagnosis not present

## 2022-11-29 DIAGNOSIS — R1084 Generalized abdominal pain: Secondary | ICD-10-CM | POA: Diagnosis not present

## 2022-12-01 DIAGNOSIS — F4310 Post-traumatic stress disorder, unspecified: Secondary | ICD-10-CM | POA: Diagnosis not present

## 2022-12-01 DIAGNOSIS — F3112 Bipolar disorder, current episode manic without psychotic features, moderate: Secondary | ICD-10-CM | POA: Diagnosis not present

## 2022-12-01 DIAGNOSIS — F840 Autistic disorder: Secondary | ICD-10-CM | POA: Diagnosis not present

## 2022-12-01 DIAGNOSIS — F902 Attention-deficit hyperactivity disorder, combined type: Secondary | ICD-10-CM | POA: Diagnosis not present

## 2022-12-02 DIAGNOSIS — I10 Essential (primary) hypertension: Secondary | ICD-10-CM | POA: Diagnosis not present

## 2022-12-02 DIAGNOSIS — E1165 Type 2 diabetes mellitus with hyperglycemia: Secondary | ICD-10-CM | POA: Diagnosis not present

## 2022-12-05 DIAGNOSIS — E221 Hyperprolactinemia: Secondary | ICD-10-CM | POA: Diagnosis not present

## 2022-12-05 DIAGNOSIS — F419 Anxiety disorder, unspecified: Secondary | ICD-10-CM | POA: Diagnosis not present

## 2022-12-05 DIAGNOSIS — G4700 Insomnia, unspecified: Secondary | ICD-10-CM | POA: Diagnosis not present

## 2022-12-05 DIAGNOSIS — F1220 Cannabis dependence, uncomplicated: Secondary | ICD-10-CM | POA: Diagnosis not present

## 2022-12-06 ENCOUNTER — Ambulatory Visit: Payer: BC Managed Care – PPO | Admitting: Nutrition

## 2022-12-08 DIAGNOSIS — F3112 Bipolar disorder, current episode manic without psychotic features, moderate: Secondary | ICD-10-CM | POA: Diagnosis not present

## 2022-12-08 DIAGNOSIS — F4310 Post-traumatic stress disorder, unspecified: Secondary | ICD-10-CM | POA: Diagnosis not present

## 2022-12-08 DIAGNOSIS — F840 Autistic disorder: Secondary | ICD-10-CM | POA: Diagnosis not present

## 2022-12-08 DIAGNOSIS — F902 Attention-deficit hyperactivity disorder, combined type: Secondary | ICD-10-CM | POA: Diagnosis not present

## 2022-12-22 DIAGNOSIS — F3112 Bipolar disorder, current episode manic without psychotic features, moderate: Secondary | ICD-10-CM | POA: Diagnosis not present

## 2022-12-22 DIAGNOSIS — F840 Autistic disorder: Secondary | ICD-10-CM | POA: Diagnosis not present

## 2022-12-22 DIAGNOSIS — F4310 Post-traumatic stress disorder, unspecified: Secondary | ICD-10-CM | POA: Diagnosis not present

## 2022-12-22 DIAGNOSIS — F902 Attention-deficit hyperactivity disorder, combined type: Secondary | ICD-10-CM | POA: Diagnosis not present

## 2022-12-27 DIAGNOSIS — E1165 Type 2 diabetes mellitus with hyperglycemia: Secondary | ICD-10-CM | POA: Diagnosis not present

## 2022-12-29 DIAGNOSIS — F3112 Bipolar disorder, current episode manic without psychotic features, moderate: Secondary | ICD-10-CM | POA: Diagnosis not present

## 2022-12-29 DIAGNOSIS — F4310 Post-traumatic stress disorder, unspecified: Secondary | ICD-10-CM | POA: Diagnosis not present

## 2022-12-29 DIAGNOSIS — F840 Autistic disorder: Secondary | ICD-10-CM | POA: Diagnosis not present

## 2022-12-29 DIAGNOSIS — F902 Attention-deficit hyperactivity disorder, combined type: Secondary | ICD-10-CM | POA: Diagnosis not present

## 2023-01-02 DIAGNOSIS — E1165 Type 2 diabetes mellitus with hyperglycemia: Secondary | ICD-10-CM | POA: Diagnosis not present

## 2023-01-02 DIAGNOSIS — Z794 Long term (current) use of insulin: Secondary | ICD-10-CM | POA: Diagnosis not present

## 2023-01-06 DIAGNOSIS — F3112 Bipolar disorder, current episode manic without psychotic features, moderate: Secondary | ICD-10-CM | POA: Diagnosis not present

## 2023-01-06 DIAGNOSIS — F4310 Post-traumatic stress disorder, unspecified: Secondary | ICD-10-CM | POA: Diagnosis not present

## 2023-01-06 DIAGNOSIS — F902 Attention-deficit hyperactivity disorder, combined type: Secondary | ICD-10-CM | POA: Diagnosis not present

## 2023-01-06 DIAGNOSIS — F840 Autistic disorder: Secondary | ICD-10-CM | POA: Diagnosis not present

## 2023-01-12 DIAGNOSIS — F3112 Bipolar disorder, current episode manic without psychotic features, moderate: Secondary | ICD-10-CM | POA: Diagnosis not present

## 2023-01-12 DIAGNOSIS — F902 Attention-deficit hyperactivity disorder, combined type: Secondary | ICD-10-CM | POA: Diagnosis not present

## 2023-01-12 DIAGNOSIS — F840 Autistic disorder: Secondary | ICD-10-CM | POA: Diagnosis not present

## 2023-01-12 DIAGNOSIS — F4310 Post-traumatic stress disorder, unspecified: Secondary | ICD-10-CM | POA: Diagnosis not present

## 2023-01-16 DIAGNOSIS — E221 Hyperprolactinemia: Secondary | ICD-10-CM | POA: Diagnosis not present

## 2023-01-16 DIAGNOSIS — F1220 Cannabis dependence, uncomplicated: Secondary | ICD-10-CM | POA: Diagnosis not present

## 2023-01-16 DIAGNOSIS — F419 Anxiety disorder, unspecified: Secondary | ICD-10-CM | POA: Diagnosis not present

## 2023-01-16 DIAGNOSIS — G4700 Insomnia, unspecified: Secondary | ICD-10-CM | POA: Diagnosis not present

## 2023-01-17 ENCOUNTER — Ambulatory Visit (INDEPENDENT_AMBULATORY_CARE_PROVIDER_SITE_OTHER): Payer: BC Managed Care – PPO

## 2023-01-17 ENCOUNTER — Ambulatory Visit
Admission: EM | Admit: 2023-01-17 | Discharge: 2023-01-17 | Disposition: A | Discharge status: Home / Self Care | Payer: BC Managed Care – PPO | Attending: Family Medicine | Admitting: Family Medicine

## 2023-01-17 DIAGNOSIS — S93402A Sprain of unspecified ligament of left ankle, initial encounter: Secondary | ICD-10-CM

## 2023-01-17 DIAGNOSIS — S93602A Unspecified sprain of left foot, initial encounter: Secondary | ICD-10-CM | POA: Diagnosis not present

## 2023-01-17 DIAGNOSIS — M25572 Pain in left ankle and joints of left foot: Secondary | ICD-10-CM

## 2023-01-17 DIAGNOSIS — M7989 Other specified soft tissue disorders: Secondary | ICD-10-CM | POA: Diagnosis not present

## 2023-01-17 NOTE — Discharge Instructions (Signed)
Limit walking while the foot/ankle are painful Use ice and elevation for pain and swelling Take ibuprofen or tylenol as needed for pain See your doctor if not improving by nest week

## 2023-01-17 NOTE — ED Triage Notes (Signed)
Pt c/o LT ankle pain since Sat am after she rolled it. Also having pain in LT pinky toe. Foot swelling started today.

## 2023-01-17 NOTE — ED Provider Notes (Signed)
[facility]    CSN: [id number] Arrival date & time: 01/17/23  1334      History   Chief Complaint Chief Complaint  Patient presents with   Ankle Pain    LT    HPI Alyssa Sanford is a 27 y.o. female.   HPI  Patient rolled her ankle, inversion injury, 3 d ago.  Still cannot bear weight comfortably.  Mild swelling Onl Hx reviewed:  mental illness, diabetes, hypertension.  On multiple medications  Past Medical History:  Diagnosis Date   ADHD    Bipolar 1 disorder (HCC)    Depression with anxiety    DM (diabetes mellitus) (HCC)    Hypertension    Impaired glucose tolerance    Strep throat    Tachyarrhythmia     Patient Active Problem List   Diagnosis Date Noted   HTN (hypertension) 11/18/2022   DKA (diabetic ketoacidosis) (HCC) 11/18/2022   Bipolar 1 disorder (HCC) 11/18/2022   Intractable nausea and vomiting 11/17/2022    Past Surgical History:  Procedure Laterality Date   AUGMENTATION MAMMAPLASTY Left 2018   saline implant only on left    OB History   No obstetric history on file.      Home Medications    Prior to Admission medications   Medication Sig Start Date End Date Taking? Authorizing Provider  pantoprazole (PROTONIX) 20 MG tablet Take 20 mg by mouth daily. 12/25/22  Yes [provider]  SEMGLEE, YFGN, 100 UNIT/ML Pen SMARTSIG:15 Unit(s) SUB-Q Every Night 12/28/22  Yes [provider]  amLODipine (NORVASC) 2.5 MG tablet Take 2.5 mg by mouth daily. 02/08/17   [provider]  amphetamine-dextroamphetamine (ADDERALL XR) 10 MG 24 hr capsule Take 20 mg by mouth daily as needed (in afternoon for attention.). 07/29/21   [provider]  amphetamine-dextroamphetamine (ADDERALL XR) 25 MG 24 hr capsule Take 10 mg by mouth every morning.    [provider]  atenolol (TENORMIN) 100 MG tablet Take 100 mg by mouth daily.    [provider]  Blood Glucose Monitoring Suppl DEVI 1 each by Does not  apply route in the morning, at noon, and at bedtime. May substitute to any manufacturer covered by patient's insurance. 11/22/22   Regalado, Belkys A, MD  clonazePAM (KLONOPIN) 0.5 MG tablet Take 0.5 mg by mouth 2 (two) times daily as needed for anxiety.    [provider]  diphenhydrAMINE (SOMINEX) 25 MG tablet Take 50 mg by mouth at bedtime as needed for itching, allergies or sleep.    [provider]  hyoscyamine (LEVSIN) 0.125 MG tablet Take by mouth every 4 (four) hours as needed.    [provider]  Insulin Pen Needle (PEN NEEDLES 3/16") 31G X 5 MM MISC 1 Application by Does not apply route daily. 11/22/22   Regalado, Belkys A, MD  Norethindrone Acetate-Ethinyl Estradiol (JUNEL 1.5/30) 1.5-30 MG-MCG tablet Take 1 tablet by mouth daily.    [provider]  ondansetron (ZOFRAN) 4 MG tablet Take 1 tablet (4 mg total) by mouth every 8 (eight) hours as needed for nausea or vomiting. 11/22/22   Regalado, Belkys A, MD  QUEtiapine (SEROQUEL) 25 MG tablet Take 25 mg by mouth at bedtime.    [provider]  risperidone (RISPERDAL) 4 MG tablet Take 4 mg by mouth 2 (two) times daily.    [provider]  traZODone (DESYREL) 50 MG tablet Take 50-100 mg by mouth See admin instructions. Take 50-100 mg (1-2  tablets) by mouth daily on the weekdays. 07/09/21   [provider]    Family History Family History  Problem Relation Age of Onset   Healthy Mother    Healthy Father    Breast cancer Maternal Grandmother     Social History Social History   Tobacco Use   Smoking status: Every Day    Types: Cigarettes  Substance Use Topics   Alcohol use: Yes   Drug use: No     Allergies   Amoxicillin and Penicillins   Review of Systems Review of Systems See HPI  Physical Exam Triage Vital Signs ED Triage Vitals  Encounter Vitals Group     BP 01/17/23 1345 135/89     Systolic BP Percentile --      Diastolic BP Percentile --      Pulse Rate  01/17/23 1345 79     Resp 01/17/23 1345 17     Temp --      Temp Source 01/17/23 1345 Oral     SpO2 01/17/23 1345 100 %     Weight --      Height --      Head Circumference --      Peak Flow --      Pain Score 01/17/23 1347 5     Pain Loc --      Pain Education --      Exclude from Growth Chart --    No data found.  Updated Vital Signs BP 135/89 (BP Location: Right Arm)   Pulse 79   Resp 17   SpO2 100%       Physical Exam Constitutional:      General: She is not in acute distress.    Appearance: She is well-developed.  HENT:     Head: Normocephalic and atraumatic.  Eyes:     Conjunctiva/sclera: Conjunctivae normal.     Pupils: Pupils are equal, round, and reactive to light.  Cardiovascular:     Rate and Rhythm: Normal rate.  Pulmonary:     Effort: Pulmonary effort is normal. No respiratory distress.  Abdominal:     General: There is no distension.     Palpations: Abdomen is soft.  Musculoskeletal:        General: Swelling, tenderness and signs of injury present. No deformity. Normal range of motion.     Cervical back: Normal range of motion.       Feet:  Skin:    General: Skin is warm and dry.  Neurological:     Mental Status: She is alert.      UC Treatments / Results  Labs (all labs ordered are listed, but only abnormal results are displayed) Labs Reviewed - No data to display  EKG   Radiology DG Foot Complete Left  Result Date: 01/17/2023 CLINICAL DATA:  Left ankle pain, rolled ankle 4 days ago EXAM: LEFT FOOT - COMPLETE 3+ VIEW; LEFT ANKLE COMPLETE - 3+ VIEW COMPARISON:  None Available. FINDINGS: There is no evidence of fracture or dislocation. There is no evidence of arthropathy or other focal bone abnormality. Mild soft tissue edema about the ankle and dorsum of the forefoot. IMPRESSION: No fracture or dislocation of the left foot or ankle. Mild soft tissue edema about the ankle and dorsum of the forefoot. Electronically Signed   By: Jearld Lesch  M.D.   On: 01/17/2023 14:58   DG Ankle Complete Left  Result Date: 01/17/2023 CLINICAL DATA:  Left ankle pain, rolled ankle 4 days ago  EXAM: LEFT FOOT - COMPLETE 3+ VIEW; LEFT ANKLE COMPLETE - 3+ VIEW COMPARISON:  None Available. FINDINGS: There is no evidence of fracture or dislocation. There is no evidence of arthropathy or other focal bone abnormality. Mild soft tissue edema about the ankle and dorsum of the forefoot. IMPRESSION: No fracture or dislocation of the left foot or ankle. Mild soft tissue edema about the ankle and dorsum of the forefoot. Electronically Signed   By: Jearld Lesch M.D.   On: 01/17/2023 14:58    Procedures Procedures (including critical care time)  Medications Ordered in UC Medications - No data to display  Initial Impression / Assessment and Plan / UC Course  I have reviewed the triage vital signs and the nursing notes.  Pertinent labs & imaging results that were available during my care of the patient were reviewed by me and considered in my medical decision making (see chart for details).   Final Clinical Impressions(s) / UC Diagnoses   Final diagnoses:  Sprain of left foot, initial encounter  Mild ankle sprain, left, initial encounter     Discharge Instructions      Limit walking while the foot/ankle are painful Use ice and elevation for pain and swelling Take ibuprofen or tylenol as needed for pain See your doctor if not improving by nest week     ED Prescriptions   None    PDMP not reviewed this encounter.   Eustace Moore, MD 01/17/23 [phone]

## 2023-01-19 DIAGNOSIS — F840 Autistic disorder: Secondary | ICD-10-CM | POA: Diagnosis not present

## 2023-01-19 DIAGNOSIS — F4310 Post-traumatic stress disorder, unspecified: Secondary | ICD-10-CM | POA: Diagnosis not present

## 2023-01-19 DIAGNOSIS — F3112 Bipolar disorder, current episode manic without psychotic features, moderate: Secondary | ICD-10-CM | POA: Diagnosis not present

## 2023-01-19 DIAGNOSIS — F902 Attention-deficit hyperactivity disorder, combined type: Secondary | ICD-10-CM | POA: Diagnosis not present

## 2023-01-26 DIAGNOSIS — F902 Attention-deficit hyperactivity disorder, combined type: Secondary | ICD-10-CM | POA: Diagnosis not present

## 2023-01-26 DIAGNOSIS — F4310 Post-traumatic stress disorder, unspecified: Secondary | ICD-10-CM | POA: Diagnosis not present

## 2023-01-26 DIAGNOSIS — F840 Autistic disorder: Secondary | ICD-10-CM | POA: Diagnosis not present

## 2023-01-26 DIAGNOSIS — F3112 Bipolar disorder, current episode manic without psychotic features, moderate: Secondary | ICD-10-CM | POA: Diagnosis not present

## 2023-01-30 DIAGNOSIS — E1165 Type 2 diabetes mellitus with hyperglycemia: Secondary | ICD-10-CM | POA: Diagnosis not present

## 2023-02-02 DIAGNOSIS — F840 Autistic disorder: Secondary | ICD-10-CM | POA: Diagnosis not present

## 2023-02-02 DIAGNOSIS — F4310 Post-traumatic stress disorder, unspecified: Secondary | ICD-10-CM | POA: Diagnosis not present

## 2023-02-02 DIAGNOSIS — F902 Attention-deficit hyperactivity disorder, combined type: Secondary | ICD-10-CM | POA: Diagnosis not present

## 2023-02-02 DIAGNOSIS — F3112 Bipolar disorder, current episode manic without psychotic features, moderate: Secondary | ICD-10-CM | POA: Diagnosis not present

## 2023-02-06 DIAGNOSIS — F840 Autistic disorder: Secondary | ICD-10-CM | POA: Diagnosis not present

## 2023-02-06 DIAGNOSIS — F3112 Bipolar disorder, current episode manic without psychotic features, moderate: Secondary | ICD-10-CM | POA: Diagnosis not present

## 2023-02-06 DIAGNOSIS — F4310 Post-traumatic stress disorder, unspecified: Secondary | ICD-10-CM | POA: Diagnosis not present

## 2023-02-06 DIAGNOSIS — F902 Attention-deficit hyperactivity disorder, combined type: Secondary | ICD-10-CM | POA: Diagnosis not present

## 2023-02-09 DIAGNOSIS — F902 Attention-deficit hyperactivity disorder, combined type: Secondary | ICD-10-CM | POA: Diagnosis not present

## 2023-02-09 DIAGNOSIS — F4310 Post-traumatic stress disorder, unspecified: Secondary | ICD-10-CM | POA: Diagnosis not present

## 2023-02-09 DIAGNOSIS — F3112 Bipolar disorder, current episode manic without psychotic features, moderate: Secondary | ICD-10-CM | POA: Diagnosis not present

## 2023-02-09 DIAGNOSIS — F840 Autistic disorder: Secondary | ICD-10-CM | POA: Diagnosis not present

## 2023-02-13 DIAGNOSIS — Z6826 Body mass index (BMI) 26.0-26.9, adult: Secondary | ICD-10-CM | POA: Diagnosis not present

## 2023-02-13 DIAGNOSIS — Z01419 Encounter for gynecological examination (general) (routine) without abnormal findings: Secondary | ICD-10-CM | POA: Diagnosis not present

## 2023-02-13 DIAGNOSIS — F1220 Cannabis dependence, uncomplicated: Secondary | ICD-10-CM | POA: Diagnosis not present

## 2023-02-13 DIAGNOSIS — G4700 Insomnia, unspecified: Secondary | ICD-10-CM | POA: Diagnosis not present

## 2023-02-13 DIAGNOSIS — F419 Anxiety disorder, unspecified: Secondary | ICD-10-CM | POA: Diagnosis not present

## 2023-02-13 DIAGNOSIS — E221 Hyperprolactinemia: Secondary | ICD-10-CM | POA: Diagnosis not present

## 2023-02-15 DIAGNOSIS — F4310 Post-traumatic stress disorder, unspecified: Secondary | ICD-10-CM | POA: Diagnosis not present

## 2023-02-15 DIAGNOSIS — F902 Attention-deficit hyperactivity disorder, combined type: Secondary | ICD-10-CM | POA: Diagnosis not present

## 2023-02-15 DIAGNOSIS — F840 Autistic disorder: Secondary | ICD-10-CM | POA: Diagnosis not present

## 2023-02-15 DIAGNOSIS — F3112 Bipolar disorder, current episode manic without psychotic features, moderate: Secondary | ICD-10-CM | POA: Diagnosis not present

## 2023-02-16 DIAGNOSIS — F3112 Bipolar disorder, current episode manic without psychotic features, moderate: Secondary | ICD-10-CM | POA: Diagnosis not present

## 2023-02-16 DIAGNOSIS — F902 Attention-deficit hyperactivity disorder, combined type: Secondary | ICD-10-CM | POA: Diagnosis not present

## 2023-02-16 DIAGNOSIS — F4310 Post-traumatic stress disorder, unspecified: Secondary | ICD-10-CM | POA: Diagnosis not present

## 2023-02-16 DIAGNOSIS — F840 Autistic disorder: Secondary | ICD-10-CM | POA: Diagnosis not present

## 2023-02-20 DIAGNOSIS — F3112 Bipolar disorder, current episode manic without psychotic features, moderate: Secondary | ICD-10-CM | POA: Diagnosis not present

## 2023-02-20 DIAGNOSIS — F4310 Post-traumatic stress disorder, unspecified: Secondary | ICD-10-CM | POA: Diagnosis not present

## 2023-02-20 DIAGNOSIS — F902 Attention-deficit hyperactivity disorder, combined type: Secondary | ICD-10-CM | POA: Diagnosis not present

## 2023-02-20 DIAGNOSIS — F840 Autistic disorder: Secondary | ICD-10-CM | POA: Diagnosis not present

## 2023-02-22 DIAGNOSIS — F3112 Bipolar disorder, current episode manic without psychotic features, moderate: Secondary | ICD-10-CM | POA: Diagnosis not present

## 2023-02-22 DIAGNOSIS — F4310 Post-traumatic stress disorder, unspecified: Secondary | ICD-10-CM | POA: Diagnosis not present

## 2023-02-22 DIAGNOSIS — F840 Autistic disorder: Secondary | ICD-10-CM | POA: Diagnosis not present

## 2023-02-22 DIAGNOSIS — F902 Attention-deficit hyperactivity disorder, combined type: Secondary | ICD-10-CM | POA: Diagnosis not present

## 2023-02-23 DIAGNOSIS — F4310 Post-traumatic stress disorder, unspecified: Secondary | ICD-10-CM | POA: Diagnosis not present

## 2023-02-23 DIAGNOSIS — F3112 Bipolar disorder, current episode manic without psychotic features, moderate: Secondary | ICD-10-CM | POA: Diagnosis not present

## 2023-02-23 DIAGNOSIS — F840 Autistic disorder: Secondary | ICD-10-CM | POA: Diagnosis not present

## 2023-02-23 DIAGNOSIS — F902 Attention-deficit hyperactivity disorder, combined type: Secondary | ICD-10-CM | POA: Diagnosis not present

## 2023-02-28 DIAGNOSIS — F3112 Bipolar disorder, current episode manic without psychotic features, moderate: Secondary | ICD-10-CM | POA: Diagnosis not present

## 2023-02-28 DIAGNOSIS — F4310 Post-traumatic stress disorder, unspecified: Secondary | ICD-10-CM | POA: Diagnosis not present

## 2023-02-28 DIAGNOSIS — F840 Autistic disorder: Secondary | ICD-10-CM | POA: Diagnosis not present

## 2023-02-28 DIAGNOSIS — F902 Attention-deficit hyperactivity disorder, combined type: Secondary | ICD-10-CM | POA: Diagnosis not present

## 2023-03-01 DIAGNOSIS — F840 Autistic disorder: Secondary | ICD-10-CM | POA: Diagnosis not present

## 2023-03-01 DIAGNOSIS — F902 Attention-deficit hyperactivity disorder, combined type: Secondary | ICD-10-CM | POA: Diagnosis not present

## 2023-03-01 DIAGNOSIS — F3112 Bipolar disorder, current episode manic without psychotic features, moderate: Secondary | ICD-10-CM | POA: Diagnosis not present

## 2023-03-01 DIAGNOSIS — F4310 Post-traumatic stress disorder, unspecified: Secondary | ICD-10-CM | POA: Diagnosis not present

## 2023-03-08 DIAGNOSIS — F840 Autistic disorder: Secondary | ICD-10-CM | POA: Diagnosis not present

## 2023-03-08 DIAGNOSIS — F902 Attention-deficit hyperactivity disorder, combined type: Secondary | ICD-10-CM | POA: Diagnosis not present

## 2023-03-08 DIAGNOSIS — F3112 Bipolar disorder, current episode manic without psychotic features, moderate: Secondary | ICD-10-CM | POA: Diagnosis not present

## 2023-03-08 DIAGNOSIS — F4310 Post-traumatic stress disorder, unspecified: Secondary | ICD-10-CM | POA: Diagnosis not present

## 2023-03-09 DIAGNOSIS — F4310 Post-traumatic stress disorder, unspecified: Secondary | ICD-10-CM | POA: Diagnosis not present

## 2023-03-09 DIAGNOSIS — F902 Attention-deficit hyperactivity disorder, combined type: Secondary | ICD-10-CM | POA: Diagnosis not present

## 2023-03-09 DIAGNOSIS — F3112 Bipolar disorder, current episode manic without psychotic features, moderate: Secondary | ICD-10-CM | POA: Diagnosis not present

## 2023-03-09 DIAGNOSIS — F840 Autistic disorder: Secondary | ICD-10-CM | POA: Diagnosis not present

## 2023-03-14 DIAGNOSIS — R350 Frequency of micturition: Secondary | ICD-10-CM | POA: Diagnosis not present

## 2023-03-15 DIAGNOSIS — F840 Autistic disorder: Secondary | ICD-10-CM | POA: Diagnosis not present

## 2023-03-15 DIAGNOSIS — F3112 Bipolar disorder, current episode manic without psychotic features, moderate: Secondary | ICD-10-CM | POA: Diagnosis not present

## 2023-03-15 DIAGNOSIS — F4310 Post-traumatic stress disorder, unspecified: Secondary | ICD-10-CM | POA: Diagnosis not present

## 2023-03-15 DIAGNOSIS — F902 Attention-deficit hyperactivity disorder, combined type: Secondary | ICD-10-CM | POA: Diagnosis not present

## 2023-03-16 DIAGNOSIS — F3112 Bipolar disorder, current episode manic without psychotic features, moderate: Secondary | ICD-10-CM | POA: Diagnosis not present

## 2023-03-16 DIAGNOSIS — F902 Attention-deficit hyperactivity disorder, combined type: Secondary | ICD-10-CM | POA: Diagnosis not present

## 2023-03-16 DIAGNOSIS — F1220 Cannabis dependence, uncomplicated: Secondary | ICD-10-CM | POA: Diagnosis not present

## 2023-03-16 DIAGNOSIS — F4310 Post-traumatic stress disorder, unspecified: Secondary | ICD-10-CM | POA: Diagnosis not present

## 2023-03-16 DIAGNOSIS — G4700 Insomnia, unspecified: Secondary | ICD-10-CM | POA: Diagnosis not present

## 2023-03-16 DIAGNOSIS — F419 Anxiety disorder, unspecified: Secondary | ICD-10-CM | POA: Diagnosis not present

## 2023-03-16 DIAGNOSIS — F840 Autistic disorder: Secondary | ICD-10-CM | POA: Diagnosis not present

## 2023-03-16 DIAGNOSIS — E221 Hyperprolactinemia: Secondary | ICD-10-CM | POA: Diagnosis not present

## 2023-03-20 DIAGNOSIS — N946 Dysmenorrhea, unspecified: Secondary | ICD-10-CM | POA: Diagnosis not present

## 2023-03-22 DIAGNOSIS — F902 Attention-deficit hyperactivity disorder, combined type: Secondary | ICD-10-CM | POA: Diagnosis not present

## 2023-03-22 DIAGNOSIS — F4310 Post-traumatic stress disorder, unspecified: Secondary | ICD-10-CM | POA: Diagnosis not present

## 2023-03-22 DIAGNOSIS — F3112 Bipolar disorder, current episode manic without psychotic features, moderate: Secondary | ICD-10-CM | POA: Diagnosis not present

## 2023-03-22 DIAGNOSIS — F840 Autistic disorder: Secondary | ICD-10-CM | POA: Diagnosis not present

## 2023-03-23 DIAGNOSIS — F840 Autistic disorder: Secondary | ICD-10-CM | POA: Diagnosis not present

## 2023-03-23 DIAGNOSIS — F902 Attention-deficit hyperactivity disorder, combined type: Secondary | ICD-10-CM | POA: Diagnosis not present

## 2023-03-23 DIAGNOSIS — F4310 Post-traumatic stress disorder, unspecified: Secondary | ICD-10-CM | POA: Diagnosis not present

## 2023-03-23 DIAGNOSIS — F3112 Bipolar disorder, current episode manic without psychotic features, moderate: Secondary | ICD-10-CM | POA: Diagnosis not present

## 2023-03-29 DIAGNOSIS — F3112 Bipolar disorder, current episode manic without psychotic features, moderate: Secondary | ICD-10-CM | POA: Diagnosis not present

## 2023-03-29 DIAGNOSIS — F419 Anxiety disorder, unspecified: Secondary | ICD-10-CM | POA: Diagnosis not present

## 2023-03-29 DIAGNOSIS — F1220 Cannabis dependence, uncomplicated: Secondary | ICD-10-CM | POA: Diagnosis not present

## 2023-03-29 DIAGNOSIS — F840 Autistic disorder: Secondary | ICD-10-CM | POA: Diagnosis not present

## 2023-03-29 DIAGNOSIS — F4310 Post-traumatic stress disorder, unspecified: Secondary | ICD-10-CM | POA: Diagnosis not present

## 2023-03-29 DIAGNOSIS — E221 Hyperprolactinemia: Secondary | ICD-10-CM | POA: Diagnosis not present

## 2023-03-29 DIAGNOSIS — F902 Attention-deficit hyperactivity disorder, combined type: Secondary | ICD-10-CM | POA: Diagnosis not present

## 2023-03-29 DIAGNOSIS — G4700 Insomnia, unspecified: Secondary | ICD-10-CM | POA: Diagnosis not present

## 2023-03-30 DIAGNOSIS — R200 Anesthesia of skin: Secondary | ICD-10-CM | POA: Diagnosis not present

## 2023-03-30 DIAGNOSIS — R110 Nausea: Secondary | ICD-10-CM | POA: Diagnosis not present

## 2023-03-31 DIAGNOSIS — F840 Autistic disorder: Secondary | ICD-10-CM | POA: Diagnosis not present

## 2023-03-31 DIAGNOSIS — F4310 Post-traumatic stress disorder, unspecified: Secondary | ICD-10-CM | POA: Diagnosis not present

## 2023-03-31 DIAGNOSIS — F902 Attention-deficit hyperactivity disorder, combined type: Secondary | ICD-10-CM | POA: Diagnosis not present

## 2023-03-31 DIAGNOSIS — F3112 Bipolar disorder, current episode manic without psychotic features, moderate: Secondary | ICD-10-CM | POA: Diagnosis not present

## 2023-04-05 DIAGNOSIS — F840 Autistic disorder: Secondary | ICD-10-CM | POA: Diagnosis not present

## 2023-04-05 DIAGNOSIS — F4310 Post-traumatic stress disorder, unspecified: Secondary | ICD-10-CM | POA: Diagnosis not present

## 2023-04-05 DIAGNOSIS — F902 Attention-deficit hyperactivity disorder, combined type: Secondary | ICD-10-CM | POA: Diagnosis not present

## 2023-04-05 DIAGNOSIS — F3112 Bipolar disorder, current episode manic without psychotic features, moderate: Secondary | ICD-10-CM | POA: Diagnosis not present

## 2023-04-06 DIAGNOSIS — F3112 Bipolar disorder, current episode manic without psychotic features, moderate: Secondary | ICD-10-CM | POA: Diagnosis not present

## 2023-04-06 DIAGNOSIS — F902 Attention-deficit hyperactivity disorder, combined type: Secondary | ICD-10-CM | POA: Diagnosis not present

## 2023-04-06 DIAGNOSIS — F840 Autistic disorder: Secondary | ICD-10-CM | POA: Diagnosis not present

## 2023-04-06 DIAGNOSIS — F4310 Post-traumatic stress disorder, unspecified: Secondary | ICD-10-CM | POA: Diagnosis not present

## 2023-04-10 DIAGNOSIS — R112 Nausea with vomiting, unspecified: Secondary | ICD-10-CM | POA: Diagnosis not present

## 2023-04-10 DIAGNOSIS — R5382 Chronic fatigue, unspecified: Secondary | ICD-10-CM | POA: Diagnosis not present

## 2023-04-10 DIAGNOSIS — R197 Diarrhea, unspecified: Secondary | ICD-10-CM | POA: Diagnosis not present

## 2023-04-10 DIAGNOSIS — R109 Unspecified abdominal pain: Secondary | ICD-10-CM | POA: Diagnosis not present

## 2023-04-12 DIAGNOSIS — F902 Attention-deficit hyperactivity disorder, combined type: Secondary | ICD-10-CM | POA: Diagnosis not present

## 2023-04-12 DIAGNOSIS — F3112 Bipolar disorder, current episode manic without psychotic features, moderate: Secondary | ICD-10-CM | POA: Diagnosis not present

## 2023-04-12 DIAGNOSIS — F840 Autistic disorder: Secondary | ICD-10-CM | POA: Diagnosis not present

## 2023-04-12 DIAGNOSIS — F4310 Post-traumatic stress disorder, unspecified: Secondary | ICD-10-CM | POA: Diagnosis not present

## 2023-04-13 DIAGNOSIS — F4310 Post-traumatic stress disorder, unspecified: Secondary | ICD-10-CM | POA: Diagnosis not present

## 2023-04-13 DIAGNOSIS — F3112 Bipolar disorder, current episode manic without psychotic features, moderate: Secondary | ICD-10-CM | POA: Diagnosis not present

## 2023-04-13 DIAGNOSIS — F840 Autistic disorder: Secondary | ICD-10-CM | POA: Diagnosis not present

## 2023-04-13 DIAGNOSIS — F902 Attention-deficit hyperactivity disorder, combined type: Secondary | ICD-10-CM | POA: Diagnosis not present

## 2023-04-17 DIAGNOSIS — F1220 Cannabis dependence, uncomplicated: Secondary | ICD-10-CM | POA: Diagnosis not present

## 2023-04-17 DIAGNOSIS — E221 Hyperprolactinemia: Secondary | ICD-10-CM | POA: Diagnosis not present

## 2023-04-17 DIAGNOSIS — F419 Anxiety disorder, unspecified: Secondary | ICD-10-CM | POA: Diagnosis not present

## 2023-04-17 DIAGNOSIS — G4700 Insomnia, unspecified: Secondary | ICD-10-CM | POA: Diagnosis not present

## 2023-04-18 DIAGNOSIS — F840 Autistic disorder: Secondary | ICD-10-CM | POA: Diagnosis not present

## 2023-04-18 DIAGNOSIS — F1220 Cannabis dependence, uncomplicated: Secondary | ICD-10-CM | POA: Diagnosis not present

## 2023-04-18 DIAGNOSIS — F419 Anxiety disorder, unspecified: Secondary | ICD-10-CM | POA: Diagnosis not present

## 2023-04-18 DIAGNOSIS — F3175 Bipolar disorder, in partial remission, most recent episode depressed: Secondary | ICD-10-CM | POA: Diagnosis not present

## 2023-04-19 DIAGNOSIS — F840 Autistic disorder: Secondary | ICD-10-CM | POA: Diagnosis not present

## 2023-04-19 DIAGNOSIS — F4310 Post-traumatic stress disorder, unspecified: Secondary | ICD-10-CM | POA: Diagnosis not present

## 2023-04-19 DIAGNOSIS — F902 Attention-deficit hyperactivity disorder, combined type: Secondary | ICD-10-CM | POA: Diagnosis not present

## 2023-04-19 DIAGNOSIS — F3112 Bipolar disorder, current episode manic without psychotic features, moderate: Secondary | ICD-10-CM | POA: Diagnosis not present

## 2023-04-20 DIAGNOSIS — F4310 Post-traumatic stress disorder, unspecified: Secondary | ICD-10-CM | POA: Diagnosis not present

## 2023-04-20 DIAGNOSIS — F3112 Bipolar disorder, current episode manic without psychotic features, moderate: Secondary | ICD-10-CM | POA: Diagnosis not present

## 2023-04-20 DIAGNOSIS — F840 Autistic disorder: Secondary | ICD-10-CM | POA: Diagnosis not present

## 2023-04-20 DIAGNOSIS — F902 Attention-deficit hyperactivity disorder, combined type: Secondary | ICD-10-CM | POA: Diagnosis not present

## 2023-04-25 DIAGNOSIS — F419 Anxiety disorder, unspecified: Secondary | ICD-10-CM | POA: Diagnosis not present

## 2023-04-25 DIAGNOSIS — F1220 Cannabis dependence, uncomplicated: Secondary | ICD-10-CM | POA: Diagnosis not present

## 2023-04-25 DIAGNOSIS — F840 Autistic disorder: Secondary | ICD-10-CM | POA: Diagnosis not present

## 2023-04-25 DIAGNOSIS — F3175 Bipolar disorder, in partial remission, most recent episode depressed: Secondary | ICD-10-CM | POA: Diagnosis not present

## 2023-04-26 DIAGNOSIS — F3112 Bipolar disorder, current episode manic without psychotic features, moderate: Secondary | ICD-10-CM | POA: Diagnosis not present

## 2023-04-26 DIAGNOSIS — F4310 Post-traumatic stress disorder, unspecified: Secondary | ICD-10-CM | POA: Diagnosis not present

## 2023-04-26 DIAGNOSIS — F902 Attention-deficit hyperactivity disorder, combined type: Secondary | ICD-10-CM | POA: Diagnosis not present

## 2023-04-26 DIAGNOSIS — F840 Autistic disorder: Secondary | ICD-10-CM | POA: Diagnosis not present

## 2023-04-27 DIAGNOSIS — F902 Attention-deficit hyperactivity disorder, combined type: Secondary | ICD-10-CM | POA: Diagnosis not present

## 2023-04-27 DIAGNOSIS — F840 Autistic disorder: Secondary | ICD-10-CM | POA: Diagnosis not present

## 2023-04-27 DIAGNOSIS — F3112 Bipolar disorder, current episode manic without psychotic features, moderate: Secondary | ICD-10-CM | POA: Diagnosis not present

## 2023-04-27 DIAGNOSIS — E1165 Type 2 diabetes mellitus with hyperglycemia: Secondary | ICD-10-CM | POA: Diagnosis not present

## 2023-04-27 DIAGNOSIS — E1169 Type 2 diabetes mellitus with other specified complication: Secondary | ICD-10-CM | POA: Diagnosis not present

## 2023-04-27 DIAGNOSIS — F4310 Post-traumatic stress disorder, unspecified: Secondary | ICD-10-CM | POA: Diagnosis not present

## 2023-04-27 DIAGNOSIS — R350 Frequency of micturition: Secondary | ICD-10-CM | POA: Diagnosis not present

## 2023-05-02 DIAGNOSIS — F840 Autistic disorder: Secondary | ICD-10-CM | POA: Diagnosis not present

## 2023-05-02 DIAGNOSIS — F3175 Bipolar disorder, in partial remission, most recent episode depressed: Secondary | ICD-10-CM | POA: Diagnosis not present

## 2023-05-02 DIAGNOSIS — F419 Anxiety disorder, unspecified: Secondary | ICD-10-CM | POA: Diagnosis not present

## 2023-05-02 DIAGNOSIS — F1220 Cannabis dependence, uncomplicated: Secondary | ICD-10-CM | POA: Diagnosis not present

## 2023-05-03 DIAGNOSIS — F5104 Psychophysiologic insomnia: Secondary | ICD-10-CM | POA: Diagnosis not present

## 2023-05-03 DIAGNOSIS — G4730 Sleep apnea, unspecified: Secondary | ICD-10-CM | POA: Diagnosis not present

## 2023-05-03 DIAGNOSIS — F4310 Post-traumatic stress disorder, unspecified: Secondary | ICD-10-CM | POA: Diagnosis not present

## 2023-05-03 DIAGNOSIS — F902 Attention-deficit hyperactivity disorder, combined type: Secondary | ICD-10-CM | POA: Diagnosis not present

## 2023-05-03 DIAGNOSIS — F840 Autistic disorder: Secondary | ICD-10-CM | POA: Diagnosis not present

## 2023-05-03 DIAGNOSIS — F419 Anxiety disorder, unspecified: Secondary | ICD-10-CM | POA: Diagnosis not present

## 2023-05-03 DIAGNOSIS — F1220 Cannabis dependence, uncomplicated: Secondary | ICD-10-CM | POA: Diagnosis not present

## 2023-05-03 DIAGNOSIS — E221 Hyperprolactinemia: Secondary | ICD-10-CM | POA: Diagnosis not present

## 2023-05-03 DIAGNOSIS — F3112 Bipolar disorder, current episode manic without psychotic features, moderate: Secondary | ICD-10-CM | POA: Diagnosis not present

## 2023-05-03 DIAGNOSIS — G4700 Insomnia, unspecified: Secondary | ICD-10-CM | POA: Diagnosis not present

## 2023-05-05 DIAGNOSIS — F1220 Cannabis dependence, uncomplicated: Secondary | ICD-10-CM | POA: Diagnosis not present

## 2023-05-05 DIAGNOSIS — E221 Hyperprolactinemia: Secondary | ICD-10-CM | POA: Diagnosis not present

## 2023-05-05 DIAGNOSIS — F419 Anxiety disorder, unspecified: Secondary | ICD-10-CM | POA: Diagnosis not present

## 2023-05-05 DIAGNOSIS — G4700 Insomnia, unspecified: Secondary | ICD-10-CM | POA: Diagnosis not present

## 2023-05-05 DIAGNOSIS — F840 Autistic disorder: Secondary | ICD-10-CM | POA: Diagnosis not present

## 2023-05-05 DIAGNOSIS — F3112 Bipolar disorder, current episode manic without psychotic features, moderate: Secondary | ICD-10-CM | POA: Diagnosis not present

## 2023-05-05 DIAGNOSIS — F902 Attention-deficit hyperactivity disorder, combined type: Secondary | ICD-10-CM | POA: Diagnosis not present

## 2023-05-05 DIAGNOSIS — F4310 Post-traumatic stress disorder, unspecified: Secondary | ICD-10-CM | POA: Diagnosis not present

## 2023-05-09 DIAGNOSIS — F419 Anxiety disorder, unspecified: Secondary | ICD-10-CM | POA: Diagnosis not present

## 2023-05-09 DIAGNOSIS — F3175 Bipolar disorder, in partial remission, most recent episode depressed: Secondary | ICD-10-CM | POA: Diagnosis not present

## 2023-05-09 DIAGNOSIS — F840 Autistic disorder: Secondary | ICD-10-CM | POA: Diagnosis not present

## 2023-05-09 DIAGNOSIS — F1220 Cannabis dependence, uncomplicated: Secondary | ICD-10-CM | POA: Diagnosis not present

## 2023-05-10 DIAGNOSIS — E1165 Type 2 diabetes mellitus with hyperglycemia: Secondary | ICD-10-CM | POA: Diagnosis not present

## 2023-05-11 DIAGNOSIS — F3112 Bipolar disorder, current episode manic without psychotic features, moderate: Secondary | ICD-10-CM | POA: Diagnosis not present

## 2023-05-11 DIAGNOSIS — F4310 Post-traumatic stress disorder, unspecified: Secondary | ICD-10-CM | POA: Diagnosis not present

## 2023-05-11 DIAGNOSIS — R112 Nausea with vomiting, unspecified: Secondary | ICD-10-CM | POA: Diagnosis not present

## 2023-05-11 DIAGNOSIS — F902 Attention-deficit hyperactivity disorder, combined type: Secondary | ICD-10-CM | POA: Diagnosis not present

## 2023-05-11 DIAGNOSIS — R1084 Generalized abdominal pain: Secondary | ICD-10-CM | POA: Diagnosis not present

## 2023-05-11 DIAGNOSIS — F840 Autistic disorder: Secondary | ICD-10-CM | POA: Diagnosis not present

## 2023-05-11 DIAGNOSIS — R197 Diarrhea, unspecified: Secondary | ICD-10-CM | POA: Diagnosis not present

## 2023-05-16 DIAGNOSIS — F3175 Bipolar disorder, in partial remission, most recent episode depressed: Secondary | ICD-10-CM | POA: Diagnosis not present

## 2023-05-16 DIAGNOSIS — F1220 Cannabis dependence, uncomplicated: Secondary | ICD-10-CM | POA: Diagnosis not present

## 2023-05-16 DIAGNOSIS — F419 Anxiety disorder, unspecified: Secondary | ICD-10-CM | POA: Diagnosis not present

## 2023-05-16 DIAGNOSIS — F840 Autistic disorder: Secondary | ICD-10-CM | POA: Diagnosis not present

## 2023-05-17 DIAGNOSIS — F4310 Post-traumatic stress disorder, unspecified: Secondary | ICD-10-CM | POA: Diagnosis not present

## 2023-05-17 DIAGNOSIS — F902 Attention-deficit hyperactivity disorder, combined type: Secondary | ICD-10-CM | POA: Diagnosis not present

## 2023-05-17 DIAGNOSIS — F3112 Bipolar disorder, current episode manic without psychotic features, moderate: Secondary | ICD-10-CM | POA: Diagnosis not present

## 2023-05-17 DIAGNOSIS — F840 Autistic disorder: Secondary | ICD-10-CM | POA: Diagnosis not present

## 2023-05-18 DIAGNOSIS — F840 Autistic disorder: Secondary | ICD-10-CM | POA: Diagnosis not present

## 2023-05-18 DIAGNOSIS — F4310 Post-traumatic stress disorder, unspecified: Secondary | ICD-10-CM | POA: Diagnosis not present

## 2023-05-18 DIAGNOSIS — F902 Attention-deficit hyperactivity disorder, combined type: Secondary | ICD-10-CM | POA: Diagnosis not present

## 2023-05-18 DIAGNOSIS — F3112 Bipolar disorder, current episode manic without psychotic features, moderate: Secondary | ICD-10-CM | POA: Diagnosis not present

## 2023-05-19 DIAGNOSIS — G4700 Insomnia, unspecified: Secondary | ICD-10-CM | POA: Diagnosis not present

## 2023-05-19 DIAGNOSIS — E221 Hyperprolactinemia: Secondary | ICD-10-CM | POA: Diagnosis not present

## 2023-05-19 DIAGNOSIS — F419 Anxiety disorder, unspecified: Secondary | ICD-10-CM | POA: Diagnosis not present

## 2023-05-19 DIAGNOSIS — F1220 Cannabis dependence, uncomplicated: Secondary | ICD-10-CM | POA: Diagnosis not present

## 2023-05-23 DIAGNOSIS — F3175 Bipolar disorder, in partial remission, most recent episode depressed: Secondary | ICD-10-CM | POA: Diagnosis not present

## 2023-05-23 DIAGNOSIS — F840 Autistic disorder: Secondary | ICD-10-CM | POA: Diagnosis not present

## 2023-05-23 DIAGNOSIS — F1220 Cannabis dependence, uncomplicated: Secondary | ICD-10-CM | POA: Diagnosis not present

## 2023-05-23 DIAGNOSIS — F419 Anxiety disorder, unspecified: Secondary | ICD-10-CM | POA: Diagnosis not present

## 2023-05-24 DIAGNOSIS — F4310 Post-traumatic stress disorder, unspecified: Secondary | ICD-10-CM | POA: Diagnosis not present

## 2023-05-24 DIAGNOSIS — F3112 Bipolar disorder, current episode manic without psychotic features, moderate: Secondary | ICD-10-CM | POA: Diagnosis not present

## 2023-05-24 DIAGNOSIS — F840 Autistic disorder: Secondary | ICD-10-CM | POA: Diagnosis not present

## 2023-05-24 DIAGNOSIS — F902 Attention-deficit hyperactivity disorder, combined type: Secondary | ICD-10-CM | POA: Diagnosis not present

## 2023-05-25 DIAGNOSIS — F3112 Bipolar disorder, current episode manic without psychotic features, moderate: Secondary | ICD-10-CM | POA: Diagnosis not present

## 2023-05-25 DIAGNOSIS — F840 Autistic disorder: Secondary | ICD-10-CM | POA: Diagnosis not present

## 2023-05-25 DIAGNOSIS — F902 Attention-deficit hyperactivity disorder, combined type: Secondary | ICD-10-CM | POA: Diagnosis not present

## 2023-05-25 DIAGNOSIS — F4310 Post-traumatic stress disorder, unspecified: Secondary | ICD-10-CM | POA: Diagnosis not present

## 2023-05-30 DIAGNOSIS — F902 Attention-deficit hyperactivity disorder, combined type: Secondary | ICD-10-CM | POA: Diagnosis not present

## 2023-05-30 DIAGNOSIS — F4310 Post-traumatic stress disorder, unspecified: Secondary | ICD-10-CM | POA: Diagnosis not present

## 2023-05-30 DIAGNOSIS — F3112 Bipolar disorder, current episode manic without psychotic features, moderate: Secondary | ICD-10-CM | POA: Diagnosis not present

## 2023-05-30 DIAGNOSIS — F840 Autistic disorder: Secondary | ICD-10-CM | POA: Diagnosis not present

## 2023-06-02 DIAGNOSIS — F319 Bipolar disorder, unspecified: Secondary | ICD-10-CM | POA: Diagnosis not present

## 2023-06-02 DIAGNOSIS — I10 Essential (primary) hypertension: Secondary | ICD-10-CM | POA: Diagnosis not present

## 2023-06-02 DIAGNOSIS — E119 Type 2 diabetes mellitus without complications: Secondary | ICD-10-CM | POA: Diagnosis not present

## 2023-06-02 DIAGNOSIS — Z794 Long term (current) use of insulin: Secondary | ICD-10-CM | POA: Diagnosis not present

## 2023-06-02 DIAGNOSIS — F5101 Primary insomnia: Secondary | ICD-10-CM | POA: Diagnosis not present

## 2023-06-12 DIAGNOSIS — F1220 Cannabis dependence, uncomplicated: Secondary | ICD-10-CM | POA: Diagnosis not present

## 2023-06-12 DIAGNOSIS — F419 Anxiety disorder, unspecified: Secondary | ICD-10-CM | POA: Diagnosis not present

## 2023-06-12 DIAGNOSIS — G4700 Insomnia, unspecified: Secondary | ICD-10-CM | POA: Diagnosis not present

## 2023-06-12 DIAGNOSIS — E221 Hyperprolactinemia: Secondary | ICD-10-CM | POA: Diagnosis not present

## 2023-06-15 DIAGNOSIS — F902 Attention-deficit hyperactivity disorder, combined type: Secondary | ICD-10-CM | POA: Diagnosis not present

## 2023-06-15 DIAGNOSIS — F4310 Post-traumatic stress disorder, unspecified: Secondary | ICD-10-CM | POA: Diagnosis not present

## 2023-06-15 DIAGNOSIS — F840 Autistic disorder: Secondary | ICD-10-CM | POA: Diagnosis not present

## 2023-06-15 DIAGNOSIS — F3112 Bipolar disorder, current episode manic without psychotic features, moderate: Secondary | ICD-10-CM | POA: Diagnosis not present

## 2023-06-21 DIAGNOSIS — F3112 Bipolar disorder, current episode manic without psychotic features, moderate: Secondary | ICD-10-CM | POA: Diagnosis not present

## 2023-06-21 DIAGNOSIS — F840 Autistic disorder: Secondary | ICD-10-CM | POA: Diagnosis not present

## 2023-06-21 DIAGNOSIS — F4310 Post-traumatic stress disorder, unspecified: Secondary | ICD-10-CM | POA: Diagnosis not present

## 2023-06-21 DIAGNOSIS — F902 Attention-deficit hyperactivity disorder, combined type: Secondary | ICD-10-CM | POA: Diagnosis not present

## 2023-07-07 DIAGNOSIS — G4733 Obstructive sleep apnea (adult) (pediatric): Secondary | ICD-10-CM | POA: Diagnosis not present

## 2023-07-09 DIAGNOSIS — G4733 Obstructive sleep apnea (adult) (pediatric): Secondary | ICD-10-CM | POA: Diagnosis not present

## 2023-07-12 DIAGNOSIS — F3112 Bipolar disorder, current episode manic without psychotic features, moderate: Secondary | ICD-10-CM | POA: Diagnosis not present

## 2023-07-12 DIAGNOSIS — F4310 Post-traumatic stress disorder, unspecified: Secondary | ICD-10-CM | POA: Diagnosis not present

## 2023-07-12 DIAGNOSIS — F840 Autistic disorder: Secondary | ICD-10-CM | POA: Diagnosis not present

## 2023-07-12 DIAGNOSIS — F902 Attention-deficit hyperactivity disorder, combined type: Secondary | ICD-10-CM | POA: Diagnosis not present

## 2023-07-13 DIAGNOSIS — F840 Autistic disorder: Secondary | ICD-10-CM | POA: Diagnosis not present

## 2023-07-13 DIAGNOSIS — F902 Attention-deficit hyperactivity disorder, combined type: Secondary | ICD-10-CM | POA: Diagnosis not present

## 2023-07-13 DIAGNOSIS — F4310 Post-traumatic stress disorder, unspecified: Secondary | ICD-10-CM | POA: Diagnosis not present

## 2023-07-13 DIAGNOSIS — F3112 Bipolar disorder, current episode manic without psychotic features, moderate: Secondary | ICD-10-CM | POA: Diagnosis not present

## 2023-07-17 DIAGNOSIS — E221 Hyperprolactinemia: Secondary | ICD-10-CM | POA: Diagnosis not present

## 2023-07-17 DIAGNOSIS — G4700 Insomnia, unspecified: Secondary | ICD-10-CM | POA: Diagnosis not present

## 2023-07-17 DIAGNOSIS — F1220 Cannabis dependence, uncomplicated: Secondary | ICD-10-CM | POA: Diagnosis not present

## 2023-07-17 DIAGNOSIS — F419 Anxiety disorder, unspecified: Secondary | ICD-10-CM | POA: Diagnosis not present

## 2023-07-19 DIAGNOSIS — E1165 Type 2 diabetes mellitus with hyperglycemia: Secondary | ICD-10-CM | POA: Diagnosis not present

## 2023-07-19 DIAGNOSIS — I10 Essential (primary) hypertension: Secondary | ICD-10-CM | POA: Diagnosis not present

## 2023-07-20 DIAGNOSIS — F840 Autistic disorder: Secondary | ICD-10-CM | POA: Diagnosis not present

## 2023-07-20 DIAGNOSIS — F4310 Post-traumatic stress disorder, unspecified: Secondary | ICD-10-CM | POA: Diagnosis not present

## 2023-07-20 DIAGNOSIS — F3112 Bipolar disorder, current episode manic without psychotic features, moderate: Secondary | ICD-10-CM | POA: Diagnosis not present

## 2023-07-20 DIAGNOSIS — F902 Attention-deficit hyperactivity disorder, combined type: Secondary | ICD-10-CM | POA: Diagnosis not present

## 2023-07-21 DIAGNOSIS — G4733 Obstructive sleep apnea (adult) (pediatric): Secondary | ICD-10-CM | POA: Diagnosis not present

## 2023-07-25 DIAGNOSIS — F840 Autistic disorder: Secondary | ICD-10-CM | POA: Diagnosis not present

## 2023-07-25 DIAGNOSIS — F3112 Bipolar disorder, current episode manic without psychotic features, moderate: Secondary | ICD-10-CM | POA: Diagnosis not present

## 2023-07-25 DIAGNOSIS — F4310 Post-traumatic stress disorder, unspecified: Secondary | ICD-10-CM | POA: Diagnosis not present

## 2023-07-25 DIAGNOSIS — F902 Attention-deficit hyperactivity disorder, combined type: Secondary | ICD-10-CM | POA: Diagnosis not present

## 2023-07-27 DIAGNOSIS — F840 Autistic disorder: Secondary | ICD-10-CM | POA: Diagnosis not present

## 2023-07-27 DIAGNOSIS — F902 Attention-deficit hyperactivity disorder, combined type: Secondary | ICD-10-CM | POA: Diagnosis not present

## 2023-07-27 DIAGNOSIS — F4310 Post-traumatic stress disorder, unspecified: Secondary | ICD-10-CM | POA: Diagnosis not present

## 2023-07-27 DIAGNOSIS — F3112 Bipolar disorder, current episode manic without psychotic features, moderate: Secondary | ICD-10-CM | POA: Diagnosis not present

## 2023-07-28 DIAGNOSIS — E1165 Type 2 diabetes mellitus with hyperglycemia: Secondary | ICD-10-CM | POA: Diagnosis not present

## 2023-07-28 DIAGNOSIS — Z794 Long term (current) use of insulin: Secondary | ICD-10-CM | POA: Diagnosis not present

## 2023-08-03 DIAGNOSIS — F902 Attention-deficit hyperactivity disorder, combined type: Secondary | ICD-10-CM | POA: Diagnosis not present

## 2023-08-03 DIAGNOSIS — F840 Autistic disorder: Secondary | ICD-10-CM | POA: Diagnosis not present

## 2023-08-03 DIAGNOSIS — F4310 Post-traumatic stress disorder, unspecified: Secondary | ICD-10-CM | POA: Diagnosis not present

## 2023-08-03 DIAGNOSIS — F3112 Bipolar disorder, current episode manic without psychotic features, moderate: Secondary | ICD-10-CM | POA: Diagnosis not present

## 2023-08-08 DIAGNOSIS — F902 Attention-deficit hyperactivity disorder, combined type: Secondary | ICD-10-CM | POA: Diagnosis not present

## 2023-08-08 DIAGNOSIS — F4310 Post-traumatic stress disorder, unspecified: Secondary | ICD-10-CM | POA: Diagnosis not present

## 2023-08-08 DIAGNOSIS — F840 Autistic disorder: Secondary | ICD-10-CM | POA: Diagnosis not present

## 2023-08-08 DIAGNOSIS — F3112 Bipolar disorder, current episode manic without psychotic features, moderate: Secondary | ICD-10-CM | POA: Diagnosis not present

## 2023-08-10 DIAGNOSIS — F902 Attention-deficit hyperactivity disorder, combined type: Secondary | ICD-10-CM | POA: Diagnosis not present

## 2023-08-10 DIAGNOSIS — F840 Autistic disorder: Secondary | ICD-10-CM | POA: Diagnosis not present

## 2023-08-10 DIAGNOSIS — F3112 Bipolar disorder, current episode manic without psychotic features, moderate: Secondary | ICD-10-CM | POA: Diagnosis not present

## 2023-08-10 DIAGNOSIS — F4310 Post-traumatic stress disorder, unspecified: Secondary | ICD-10-CM | POA: Diagnosis not present

## 2023-08-14 DIAGNOSIS — F1220 Cannabis dependence, uncomplicated: Secondary | ICD-10-CM | POA: Diagnosis not present

## 2023-08-14 DIAGNOSIS — G4700 Insomnia, unspecified: Secondary | ICD-10-CM | POA: Diagnosis not present

## 2023-08-14 DIAGNOSIS — E221 Hyperprolactinemia: Secondary | ICD-10-CM | POA: Diagnosis not present

## 2023-08-14 DIAGNOSIS — F419 Anxiety disorder, unspecified: Secondary | ICD-10-CM | POA: Diagnosis not present

## 2023-08-18 DIAGNOSIS — E1165 Type 2 diabetes mellitus with hyperglycemia: Secondary | ICD-10-CM | POA: Diagnosis not present

## 2023-08-21 DIAGNOSIS — G4733 Obstructive sleep apnea (adult) (pediatric): Secondary | ICD-10-CM | POA: Diagnosis not present

## 2023-08-21 DIAGNOSIS — F3164 Bipolar disorder, current episode mixed, severe, with psychotic features: Secondary | ICD-10-CM | POA: Diagnosis not present

## 2023-08-28 DIAGNOSIS — F419 Anxiety disorder, unspecified: Secondary | ICD-10-CM | POA: Diagnosis not present

## 2023-08-28 DIAGNOSIS — Z794 Long term (current) use of insulin: Secondary | ICD-10-CM | POA: Diagnosis not present

## 2023-08-28 DIAGNOSIS — E1165 Type 2 diabetes mellitus with hyperglycemia: Secondary | ICD-10-CM | POA: Diagnosis not present

## 2023-08-28 DIAGNOSIS — E221 Hyperprolactinemia: Secondary | ICD-10-CM | POA: Diagnosis not present

## 2023-08-28 DIAGNOSIS — G4700 Insomnia, unspecified: Secondary | ICD-10-CM | POA: Diagnosis not present

## 2023-08-28 DIAGNOSIS — F1220 Cannabis dependence, uncomplicated: Secondary | ICD-10-CM | POA: Diagnosis not present

## 2023-08-29 DIAGNOSIS — F3164 Bipolar disorder, current episode mixed, severe, with psychotic features: Secondary | ICD-10-CM | POA: Diagnosis not present

## 2023-09-04 DIAGNOSIS — F3164 Bipolar disorder, current episode mixed, severe, with psychotic features: Secondary | ICD-10-CM | POA: Diagnosis not present

## 2023-09-11 DIAGNOSIS — F1220 Cannabis dependence, uncomplicated: Secondary | ICD-10-CM | POA: Diagnosis not present

## 2023-09-11 DIAGNOSIS — G4700 Insomnia, unspecified: Secondary | ICD-10-CM | POA: Diagnosis not present

## 2023-09-11 DIAGNOSIS — F419 Anxiety disorder, unspecified: Secondary | ICD-10-CM | POA: Diagnosis not present

## 2023-09-11 DIAGNOSIS — E221 Hyperprolactinemia: Secondary | ICD-10-CM | POA: Diagnosis not present

## 2023-09-13 DIAGNOSIS — F3164 Bipolar disorder, current episode mixed, severe, with psychotic features: Secondary | ICD-10-CM | POA: Diagnosis not present

## 2023-09-18 DIAGNOSIS — G4733 Obstructive sleep apnea (adult) (pediatric): Secondary | ICD-10-CM | POA: Diagnosis not present

## 2023-09-18 DIAGNOSIS — F3164 Bipolar disorder, current episode mixed, severe, with psychotic features: Secondary | ICD-10-CM | POA: Diagnosis not present

## 2023-09-22 DIAGNOSIS — R42 Dizziness and giddiness: Secondary | ICD-10-CM | POA: Diagnosis not present

## 2023-09-22 DIAGNOSIS — R109 Unspecified abdominal pain: Secondary | ICD-10-CM | POA: Diagnosis not present

## 2023-09-22 DIAGNOSIS — R200 Anesthesia of skin: Secondary | ICD-10-CM | POA: Diagnosis not present

## 2023-09-25 DIAGNOSIS — F3164 Bipolar disorder, current episode mixed, severe, with psychotic features: Secondary | ICD-10-CM | POA: Diagnosis not present

## 2023-09-28 DIAGNOSIS — F3164 Bipolar disorder, current episode mixed, severe, with psychotic features: Secondary | ICD-10-CM | POA: Diagnosis not present

## 2023-09-29 DIAGNOSIS — F1220 Cannabis dependence, uncomplicated: Secondary | ICD-10-CM | POA: Diagnosis not present

## 2023-09-29 DIAGNOSIS — G4700 Insomnia, unspecified: Secondary | ICD-10-CM | POA: Diagnosis not present

## 2023-09-29 DIAGNOSIS — F419 Anxiety disorder, unspecified: Secondary | ICD-10-CM | POA: Diagnosis not present

## 2023-09-29 DIAGNOSIS — E221 Hyperprolactinemia: Secondary | ICD-10-CM | POA: Diagnosis not present

## 2023-10-02 DIAGNOSIS — Z3202 Encounter for pregnancy test, result negative: Secondary | ICD-10-CM | POA: Diagnosis not present

## 2023-10-02 DIAGNOSIS — F909 Attention-deficit hyperactivity disorder, unspecified type: Secondary | ICD-10-CM | POA: Diagnosis not present

## 2023-10-02 DIAGNOSIS — Z3049 Encounter for surveillance of other contraceptives: Secondary | ICD-10-CM | POA: Diagnosis not present

## 2023-10-04 DIAGNOSIS — F909 Attention-deficit hyperactivity disorder, unspecified type: Secondary | ICD-10-CM | POA: Diagnosis not present

## 2023-10-05 DIAGNOSIS — F3164 Bipolar disorder, current episode mixed, severe, with psychotic features: Secondary | ICD-10-CM | POA: Diagnosis not present

## 2023-10-10 DIAGNOSIS — H9193 Unspecified hearing loss, bilateral: Secondary | ICD-10-CM | POA: Diagnosis not present

## 2023-10-10 DIAGNOSIS — K5900 Constipation, unspecified: Secondary | ICD-10-CM | POA: Diagnosis not present

## 2023-10-10 DIAGNOSIS — F909 Attention-deficit hyperactivity disorder, unspecified type: Secondary | ICD-10-CM | POA: Diagnosis not present

## 2023-10-10 DIAGNOSIS — M21372 Foot drop, left foot: Secondary | ICD-10-CM | POA: Diagnosis not present

## 2023-10-16 DIAGNOSIS — F840 Autistic disorder: Secondary | ICD-10-CM | POA: Diagnosis not present

## 2023-10-16 DIAGNOSIS — F3164 Bipolar disorder, current episode mixed, severe, with psychotic features: Secondary | ICD-10-CM | POA: Diagnosis not present

## 2023-10-16 DIAGNOSIS — F909 Attention-deficit hyperactivity disorder, unspecified type: Secondary | ICD-10-CM | POA: Diagnosis not present

## 2023-10-16 DIAGNOSIS — F4310 Post-traumatic stress disorder, unspecified: Secondary | ICD-10-CM | POA: Diagnosis not present

## 2023-10-17 DIAGNOSIS — E221 Hyperprolactinemia: Secondary | ICD-10-CM | POA: Diagnosis not present

## 2023-10-17 DIAGNOSIS — G4700 Insomnia, unspecified: Secondary | ICD-10-CM | POA: Diagnosis not present

## 2023-10-17 DIAGNOSIS — F1220 Cannabis dependence, uncomplicated: Secondary | ICD-10-CM | POA: Diagnosis not present

## 2023-10-17 DIAGNOSIS — F840 Autistic disorder: Secondary | ICD-10-CM | POA: Diagnosis not present

## 2023-10-17 DIAGNOSIS — F419 Anxiety disorder, unspecified: Secondary | ICD-10-CM | POA: Diagnosis not present

## 2023-10-17 DIAGNOSIS — F3164 Bipolar disorder, current episode mixed, severe, with psychotic features: Secondary | ICD-10-CM | POA: Diagnosis not present

## 2023-10-17 DIAGNOSIS — F4310 Post-traumatic stress disorder, unspecified: Secondary | ICD-10-CM | POA: Diagnosis not present

## 2023-10-17 DIAGNOSIS — F909 Attention-deficit hyperactivity disorder, unspecified type: Secondary | ICD-10-CM | POA: Diagnosis not present

## 2023-10-18 DIAGNOSIS — H903 Sensorineural hearing loss, bilateral: Secondary | ICD-10-CM | POA: Diagnosis not present

## 2023-10-24 DIAGNOSIS — F840 Autistic disorder: Secondary | ICD-10-CM | POA: Diagnosis not present

## 2023-10-24 DIAGNOSIS — F3164 Bipolar disorder, current episode mixed, severe, with psychotic features: Secondary | ICD-10-CM | POA: Diagnosis not present

## 2023-10-24 DIAGNOSIS — F4310 Post-traumatic stress disorder, unspecified: Secondary | ICD-10-CM | POA: Diagnosis not present

## 2023-10-24 DIAGNOSIS — F909 Attention-deficit hyperactivity disorder, unspecified type: Secondary | ICD-10-CM | POA: Diagnosis not present

## 2023-10-26 DIAGNOSIS — F909 Attention-deficit hyperactivity disorder, unspecified type: Secondary | ICD-10-CM | POA: Diagnosis not present

## 2023-10-26 DIAGNOSIS — F4310 Post-traumatic stress disorder, unspecified: Secondary | ICD-10-CM | POA: Diagnosis not present

## 2023-10-27 DIAGNOSIS — R34 Anuria and oliguria: Secondary | ICD-10-CM | POA: Diagnosis not present

## 2023-10-30 DIAGNOSIS — F4310 Post-traumatic stress disorder, unspecified: Secondary | ICD-10-CM | POA: Diagnosis not present

## 2023-10-30 DIAGNOSIS — F419 Anxiety disorder, unspecified: Secondary | ICD-10-CM | POA: Diagnosis not present

## 2023-10-30 DIAGNOSIS — G4700 Insomnia, unspecified: Secondary | ICD-10-CM | POA: Diagnosis not present

## 2023-10-30 DIAGNOSIS — F3164 Bipolar disorder, current episode mixed, severe, with psychotic features: Secondary | ICD-10-CM | POA: Diagnosis not present

## 2023-10-30 DIAGNOSIS — F909 Attention-deficit hyperactivity disorder, unspecified type: Secondary | ICD-10-CM | POA: Diagnosis not present

## 2023-10-30 DIAGNOSIS — F840 Autistic disorder: Secondary | ICD-10-CM | POA: Diagnosis not present

## 2023-10-30 DIAGNOSIS — F1220 Cannabis dependence, uncomplicated: Secondary | ICD-10-CM | POA: Diagnosis not present

## 2023-10-30 DIAGNOSIS — E221 Hyperprolactinemia: Secondary | ICD-10-CM | POA: Diagnosis not present

## 2023-11-02 DIAGNOSIS — F840 Autistic disorder: Secondary | ICD-10-CM | POA: Diagnosis not present

## 2023-11-02 DIAGNOSIS — F4310 Post-traumatic stress disorder, unspecified: Secondary | ICD-10-CM | POA: Diagnosis not present

## 2023-11-02 DIAGNOSIS — F3164 Bipolar disorder, current episode mixed, severe, with psychotic features: Secondary | ICD-10-CM | POA: Diagnosis not present

## 2023-11-02 DIAGNOSIS — F909 Attention-deficit hyperactivity disorder, unspecified type: Secondary | ICD-10-CM | POA: Diagnosis not present

## 2023-11-06 DIAGNOSIS — F4310 Post-traumatic stress disorder, unspecified: Secondary | ICD-10-CM | POA: Diagnosis not present

## 2023-11-06 DIAGNOSIS — F3164 Bipolar disorder, current episode mixed, severe, with psychotic features: Secondary | ICD-10-CM | POA: Diagnosis not present

## 2023-11-06 DIAGNOSIS — F909 Attention-deficit hyperactivity disorder, unspecified type: Secondary | ICD-10-CM | POA: Diagnosis not present

## 2023-11-06 DIAGNOSIS — F840 Autistic disorder: Secondary | ICD-10-CM | POA: Diagnosis not present

## 2023-11-07 DIAGNOSIS — R0789 Other chest pain: Secondary | ICD-10-CM | POA: Diagnosis not present

## 2023-11-07 DIAGNOSIS — R4189 Other symptoms and signs involving cognitive functions and awareness: Secondary | ICD-10-CM | POA: Diagnosis not present

## 2023-11-09 DIAGNOSIS — F840 Autistic disorder: Secondary | ICD-10-CM | POA: Diagnosis not present

## 2023-11-09 DIAGNOSIS — H903 Sensorineural hearing loss, bilateral: Secondary | ICD-10-CM | POA: Diagnosis not present

## 2023-11-09 DIAGNOSIS — F909 Attention-deficit hyperactivity disorder, unspecified type: Secondary | ICD-10-CM | POA: Diagnosis not present

## 2023-11-09 DIAGNOSIS — T7431XA Adult psychological abuse, confirmed, initial encounter: Secondary | ICD-10-CM | POA: Diagnosis not present

## 2023-11-09 DIAGNOSIS — F4310 Post-traumatic stress disorder, unspecified: Secondary | ICD-10-CM | POA: Diagnosis not present

## 2023-11-09 DIAGNOSIS — F3164 Bipolar disorder, current episode mixed, severe, with psychotic features: Secondary | ICD-10-CM | POA: Diagnosis not present

## 2023-11-13 DIAGNOSIS — F1220 Cannabis dependence, uncomplicated: Secondary | ICD-10-CM | POA: Diagnosis not present

## 2023-11-13 DIAGNOSIS — F840 Autistic disorder: Secondary | ICD-10-CM | POA: Diagnosis not present

## 2023-11-13 DIAGNOSIS — E221 Hyperprolactinemia: Secondary | ICD-10-CM | POA: Diagnosis not present

## 2023-11-13 DIAGNOSIS — F3164 Bipolar disorder, current episode mixed, severe, with psychotic features: Secondary | ICD-10-CM | POA: Diagnosis not present

## 2023-11-13 DIAGNOSIS — G4700 Insomnia, unspecified: Secondary | ICD-10-CM | POA: Diagnosis not present

## 2023-11-13 DIAGNOSIS — F419 Anxiety disorder, unspecified: Secondary | ICD-10-CM | POA: Diagnosis not present

## 2023-11-13 DIAGNOSIS — F909 Attention-deficit hyperactivity disorder, unspecified type: Secondary | ICD-10-CM | POA: Diagnosis not present

## 2023-11-13 DIAGNOSIS — F4310 Post-traumatic stress disorder, unspecified: Secondary | ICD-10-CM | POA: Diagnosis not present

## 2023-11-16 DIAGNOSIS — F4310 Post-traumatic stress disorder, unspecified: Secondary | ICD-10-CM | POA: Diagnosis not present

## 2023-11-16 DIAGNOSIS — F840 Autistic disorder: Secondary | ICD-10-CM | POA: Diagnosis not present

## 2023-11-16 DIAGNOSIS — F909 Attention-deficit hyperactivity disorder, unspecified type: Secondary | ICD-10-CM | POA: Diagnosis not present

## 2023-11-16 DIAGNOSIS — F3164 Bipolar disorder, current episode mixed, severe, with psychotic features: Secondary | ICD-10-CM | POA: Diagnosis not present

## 2023-11-17 DIAGNOSIS — F1220 Cannabis dependence, uncomplicated: Secondary | ICD-10-CM | POA: Diagnosis not present

## 2023-11-17 DIAGNOSIS — G4700 Insomnia, unspecified: Secondary | ICD-10-CM | POA: Diagnosis not present

## 2023-11-17 DIAGNOSIS — E221 Hyperprolactinemia: Secondary | ICD-10-CM | POA: Diagnosis not present

## 2023-11-17 DIAGNOSIS — F419 Anxiety disorder, unspecified: Secondary | ICD-10-CM | POA: Diagnosis not present

## 2023-11-20 DIAGNOSIS — F840 Autistic disorder: Secondary | ICD-10-CM | POA: Diagnosis not present

## 2023-11-20 DIAGNOSIS — F4310 Post-traumatic stress disorder, unspecified: Secondary | ICD-10-CM | POA: Diagnosis not present

## 2023-11-20 DIAGNOSIS — F3164 Bipolar disorder, current episode mixed, severe, with psychotic features: Secondary | ICD-10-CM | POA: Diagnosis not present

## 2023-11-20 DIAGNOSIS — F909 Attention-deficit hyperactivity disorder, unspecified type: Secondary | ICD-10-CM | POA: Diagnosis not present

## 2023-11-23 DIAGNOSIS — F909 Attention-deficit hyperactivity disorder, unspecified type: Secondary | ICD-10-CM | POA: Diagnosis not present

## 2023-11-23 DIAGNOSIS — F840 Autistic disorder: Secondary | ICD-10-CM | POA: Diagnosis not present

## 2023-11-23 DIAGNOSIS — F4310 Post-traumatic stress disorder, unspecified: Secondary | ICD-10-CM | POA: Diagnosis not present

## 2023-11-23 DIAGNOSIS — F3164 Bipolar disorder, current episode mixed, severe, with psychotic features: Secondary | ICD-10-CM | POA: Diagnosis not present

## 2023-11-24 DIAGNOSIS — L709 Acne, unspecified: Secondary | ICD-10-CM | POA: Diagnosis not present

## 2023-11-24 DIAGNOSIS — R944 Abnormal results of kidney function studies: Secondary | ICD-10-CM | POA: Diagnosis not present

## 2023-11-24 DIAGNOSIS — R7989 Other specified abnormal findings of blood chemistry: Secondary | ICD-10-CM | POA: Diagnosis not present

## 2023-11-28 DIAGNOSIS — F3164 Bipolar disorder, current episode mixed, severe, with psychotic features: Secondary | ICD-10-CM | POA: Diagnosis not present

## 2023-11-28 DIAGNOSIS — F909 Attention-deficit hyperactivity disorder, unspecified type: Secondary | ICD-10-CM | POA: Diagnosis not present

## 2023-11-28 DIAGNOSIS — F840 Autistic disorder: Secondary | ICD-10-CM | POA: Diagnosis not present

## 2023-11-28 DIAGNOSIS — F4312 Post-traumatic stress disorder, chronic: Secondary | ICD-10-CM | POA: Diagnosis not present

## 2023-11-29 DIAGNOSIS — Z711 Person with feared health complaint in whom no diagnosis is made: Secondary | ICD-10-CM | POA: Diagnosis not present

## 2023-11-29 DIAGNOSIS — Z794 Long term (current) use of insulin: Secondary | ICD-10-CM | POA: Diagnosis not present

## 2023-11-29 DIAGNOSIS — F419 Anxiety disorder, unspecified: Secondary | ICD-10-CM | POA: Diagnosis not present

## 2023-11-29 DIAGNOSIS — F1220 Cannabis dependence, uncomplicated: Secondary | ICD-10-CM | POA: Diagnosis not present

## 2023-11-29 DIAGNOSIS — R1084 Generalized abdominal pain: Secondary | ICD-10-CM | POA: Diagnosis not present

## 2023-11-29 DIAGNOSIS — E221 Hyperprolactinemia: Secondary | ICD-10-CM | POA: Diagnosis not present

## 2023-11-29 DIAGNOSIS — Z79899 Other long term (current) drug therapy: Secondary | ICD-10-CM | POA: Diagnosis not present

## 2023-11-29 DIAGNOSIS — K625 Hemorrhage of anus and rectum: Secondary | ICD-10-CM | POA: Diagnosis not present

## 2023-11-29 DIAGNOSIS — G4700 Insomnia, unspecified: Secondary | ICD-10-CM | POA: Diagnosis not present

## 2023-11-29 DIAGNOSIS — F17290 Nicotine dependence, other tobacco product, uncomplicated: Secondary | ICD-10-CM | POA: Diagnosis not present

## 2023-11-30 DIAGNOSIS — N179 Acute kidney failure, unspecified: Secondary | ICD-10-CM | POA: Diagnosis not present

## 2023-11-30 DIAGNOSIS — F909 Attention-deficit hyperactivity disorder, unspecified type: Secondary | ICD-10-CM | POA: Diagnosis not present

## 2023-11-30 DIAGNOSIS — F840 Autistic disorder: Secondary | ICD-10-CM | POA: Diagnosis not present

## 2023-11-30 DIAGNOSIS — F3164 Bipolar disorder, current episode mixed, severe, with psychotic features: Secondary | ICD-10-CM | POA: Diagnosis not present

## 2023-11-30 DIAGNOSIS — F4312 Post-traumatic stress disorder, chronic: Secondary | ICD-10-CM | POA: Diagnosis not present

## 2023-11-30 DIAGNOSIS — I10 Essential (primary) hypertension: Secondary | ICD-10-CM | POA: Diagnosis not present

## 2023-12-01 DIAGNOSIS — R1084 Generalized abdominal pain: Secondary | ICD-10-CM | POA: Diagnosis not present

## 2023-12-01 DIAGNOSIS — K633 Ulcer of intestine: Secondary | ICD-10-CM | POA: Diagnosis not present

## 2023-12-01 DIAGNOSIS — Z794 Long term (current) use of insulin: Secondary | ICD-10-CM | POA: Diagnosis not present

## 2023-12-01 DIAGNOSIS — E119 Type 2 diabetes mellitus without complications: Secondary | ICD-10-CM | POA: Diagnosis not present

## 2023-12-01 DIAGNOSIS — K6389 Other specified diseases of intestine: Secondary | ICD-10-CM | POA: Diagnosis not present

## 2023-12-01 DIAGNOSIS — Z7985 Long-term (current) use of injectable non-insulin antidiabetic drugs: Secondary | ICD-10-CM | POA: Diagnosis not present

## 2023-12-01 DIAGNOSIS — Z860101 Personal history of adenomatous and serrated colon polyps: Secondary | ICD-10-CM | POA: Diagnosis not present

## 2023-12-01 DIAGNOSIS — R198 Other specified symptoms and signs involving the digestive system and abdomen: Secondary | ICD-10-CM | POA: Diagnosis not present

## 2023-12-04 DIAGNOSIS — F909 Attention-deficit hyperactivity disorder, unspecified type: Secondary | ICD-10-CM | POA: Diagnosis not present

## 2023-12-04 DIAGNOSIS — F840 Autistic disorder: Secondary | ICD-10-CM | POA: Diagnosis not present

## 2023-12-04 DIAGNOSIS — F4312 Post-traumatic stress disorder, chronic: Secondary | ICD-10-CM | POA: Diagnosis not present

## 2023-12-04 DIAGNOSIS — F3164 Bipolar disorder, current episode mixed, severe, with psychotic features: Secondary | ICD-10-CM | POA: Diagnosis not present

## 2023-12-06 DIAGNOSIS — G4733 Obstructive sleep apnea (adult) (pediatric): Secondary | ICD-10-CM | POA: Diagnosis not present

## 2023-12-06 DIAGNOSIS — F5101 Primary insomnia: Secondary | ICD-10-CM | POA: Diagnosis not present

## 2023-12-07 DIAGNOSIS — F909 Attention-deficit hyperactivity disorder, unspecified type: Secondary | ICD-10-CM | POA: Diagnosis not present

## 2023-12-07 DIAGNOSIS — F4312 Post-traumatic stress disorder, chronic: Secondary | ICD-10-CM | POA: Diagnosis not present

## 2023-12-07 DIAGNOSIS — F3164 Bipolar disorder, current episode mixed, severe, with psychotic features: Secondary | ICD-10-CM | POA: Diagnosis not present

## 2023-12-07 DIAGNOSIS — F840 Autistic disorder: Secondary | ICD-10-CM | POA: Diagnosis not present

## 2023-12-11 DIAGNOSIS — G4700 Insomnia, unspecified: Secondary | ICD-10-CM | POA: Diagnosis not present

## 2023-12-11 DIAGNOSIS — F419 Anxiety disorder, unspecified: Secondary | ICD-10-CM | POA: Diagnosis not present

## 2023-12-11 DIAGNOSIS — F840 Autistic disorder: Secondary | ICD-10-CM | POA: Diagnosis not present

## 2023-12-11 DIAGNOSIS — F3164 Bipolar disorder, current episode mixed, severe, with psychotic features: Secondary | ICD-10-CM | POA: Diagnosis not present

## 2023-12-11 DIAGNOSIS — F4312 Post-traumatic stress disorder, chronic: Secondary | ICD-10-CM | POA: Diagnosis not present

## 2023-12-11 DIAGNOSIS — E221 Hyperprolactinemia: Secondary | ICD-10-CM | POA: Diagnosis not present

## 2023-12-11 DIAGNOSIS — F909 Attention-deficit hyperactivity disorder, unspecified type: Secondary | ICD-10-CM | POA: Diagnosis not present

## 2023-12-11 DIAGNOSIS — F1220 Cannabis dependence, uncomplicated: Secondary | ICD-10-CM | POA: Diagnosis not present

## 2023-12-14 DIAGNOSIS — F4312 Post-traumatic stress disorder, chronic: Secondary | ICD-10-CM | POA: Diagnosis not present

## 2023-12-14 DIAGNOSIS — F840 Autistic disorder: Secondary | ICD-10-CM | POA: Diagnosis not present

## 2023-12-14 DIAGNOSIS — F3164 Bipolar disorder, current episode mixed, severe, with psychotic features: Secondary | ICD-10-CM | POA: Diagnosis not present

## 2023-12-14 DIAGNOSIS — F909 Attention-deficit hyperactivity disorder, unspecified type: Secondary | ICD-10-CM | POA: Diagnosis not present

## 2023-12-18 DIAGNOSIS — R6889 Other general symptoms and signs: Secondary | ICD-10-CM | POA: Diagnosis not present

## 2023-12-18 DIAGNOSIS — F3164 Bipolar disorder, current episode mixed, severe, with psychotic features: Secondary | ICD-10-CM | POA: Diagnosis not present

## 2023-12-18 DIAGNOSIS — R3916 Straining to void: Secondary | ICD-10-CM | POA: Diagnosis not present

## 2023-12-18 DIAGNOSIS — F840 Autistic disorder: Secondary | ICD-10-CM | POA: Diagnosis not present

## 2023-12-18 DIAGNOSIS — F4312 Post-traumatic stress disorder, chronic: Secondary | ICD-10-CM | POA: Diagnosis not present

## 2023-12-18 DIAGNOSIS — R3914 Feeling of incomplete bladder emptying: Secondary | ICD-10-CM | POA: Diagnosis not present

## 2023-12-18 DIAGNOSIS — N3010 Interstitial cystitis (chronic) without hematuria: Secondary | ICD-10-CM | POA: Diagnosis not present

## 2023-12-18 DIAGNOSIS — F909 Attention-deficit hyperactivity disorder, unspecified type: Secondary | ICD-10-CM | POA: Diagnosis not present

## 2023-12-20 DIAGNOSIS — F909 Attention-deficit hyperactivity disorder, unspecified type: Secondary | ICD-10-CM | POA: Diagnosis not present

## 2023-12-20 DIAGNOSIS — F840 Autistic disorder: Secondary | ICD-10-CM | POA: Diagnosis not present

## 2023-12-20 DIAGNOSIS — F3164 Bipolar disorder, current episode mixed, severe, with psychotic features: Secondary | ICD-10-CM | POA: Diagnosis not present

## 2023-12-20 DIAGNOSIS — F4312 Post-traumatic stress disorder, chronic: Secondary | ICD-10-CM | POA: Diagnosis not present

## 2023-12-21 DIAGNOSIS — F3164 Bipolar disorder, current episode mixed, severe, with psychotic features: Secondary | ICD-10-CM | POA: Diagnosis not present

## 2023-12-21 DIAGNOSIS — F3112 Bipolar disorder, current episode manic without psychotic features, moderate: Secondary | ICD-10-CM | POA: Diagnosis not present

## 2023-12-21 DIAGNOSIS — F909 Attention-deficit hyperactivity disorder, unspecified type: Secondary | ICD-10-CM | POA: Diagnosis not present

## 2023-12-21 DIAGNOSIS — F4312 Post-traumatic stress disorder, chronic: Secondary | ICD-10-CM | POA: Diagnosis not present

## 2023-12-21 DIAGNOSIS — F840 Autistic disorder: Secondary | ICD-10-CM | POA: Diagnosis not present

## 2023-12-25 DIAGNOSIS — F909 Attention-deficit hyperactivity disorder, unspecified type: Secondary | ICD-10-CM | POA: Diagnosis not present

## 2023-12-25 DIAGNOSIS — F840 Autistic disorder: Secondary | ICD-10-CM | POA: Diagnosis not present

## 2023-12-25 DIAGNOSIS — F4312 Post-traumatic stress disorder, chronic: Secondary | ICD-10-CM | POA: Diagnosis not present

## 2023-12-25 DIAGNOSIS — F3164 Bipolar disorder, current episode mixed, severe, with psychotic features: Secondary | ICD-10-CM | POA: Diagnosis not present

## 2023-12-26 DIAGNOSIS — R6889 Other general symptoms and signs: Secondary | ICD-10-CM | POA: Diagnosis not present

## 2023-12-28 DIAGNOSIS — R3914 Feeling of incomplete bladder emptying: Secondary | ICD-10-CM | POA: Diagnosis not present

## 2023-12-28 DIAGNOSIS — F3164 Bipolar disorder, current episode mixed, severe, with psychotic features: Secondary | ICD-10-CM | POA: Diagnosis not present

## 2023-12-28 DIAGNOSIS — F909 Attention-deficit hyperactivity disorder, unspecified type: Secondary | ICD-10-CM | POA: Diagnosis not present

## 2023-12-28 DIAGNOSIS — F840 Autistic disorder: Secondary | ICD-10-CM | POA: Diagnosis not present

## 2023-12-28 DIAGNOSIS — F4312 Post-traumatic stress disorder, chronic: Secondary | ICD-10-CM | POA: Diagnosis not present

## 2023-12-28 DIAGNOSIS — N3010 Interstitial cystitis (chronic) without hematuria: Secondary | ICD-10-CM | POA: Diagnosis not present

## 2023-12-29 DIAGNOSIS — E221 Hyperprolactinemia: Secondary | ICD-10-CM | POA: Diagnosis not present

## 2023-12-29 DIAGNOSIS — G4700 Insomnia, unspecified: Secondary | ICD-10-CM | POA: Diagnosis not present

## 2023-12-29 DIAGNOSIS — F419 Anxiety disorder, unspecified: Secondary | ICD-10-CM | POA: Diagnosis not present

## 2023-12-29 DIAGNOSIS — F1220 Cannabis dependence, uncomplicated: Secondary | ICD-10-CM | POA: Diagnosis not present

## 2024-01-01 DIAGNOSIS — F4312 Post-traumatic stress disorder, chronic: Secondary | ICD-10-CM | POA: Diagnosis not present

## 2024-01-01 DIAGNOSIS — F909 Attention-deficit hyperactivity disorder, unspecified type: Secondary | ICD-10-CM | POA: Diagnosis not present

## 2024-01-01 DIAGNOSIS — F840 Autistic disorder: Secondary | ICD-10-CM | POA: Diagnosis not present

## 2024-01-01 DIAGNOSIS — F3164 Bipolar disorder, current episode mixed, severe, with psychotic features: Secondary | ICD-10-CM | POA: Diagnosis not present

## 2024-01-04 DIAGNOSIS — R350 Frequency of micturition: Secondary | ICD-10-CM | POA: Diagnosis not present

## 2024-01-04 DIAGNOSIS — R590 Localized enlarged lymph nodes: Secondary | ICD-10-CM | POA: Diagnosis not present

## 2024-01-04 DIAGNOSIS — K5909 Other constipation: Secondary | ICD-10-CM | POA: Diagnosis not present

## 2024-01-04 DIAGNOSIS — F3164 Bipolar disorder, current episode mixed, severe, with psychotic features: Secondary | ICD-10-CM | POA: Diagnosis not present

## 2024-01-04 DIAGNOSIS — F840 Autistic disorder: Secondary | ICD-10-CM | POA: Diagnosis not present

## 2024-01-04 DIAGNOSIS — F4312 Post-traumatic stress disorder, chronic: Secondary | ICD-10-CM | POA: Diagnosis not present

## 2024-01-04 DIAGNOSIS — F909 Attention-deficit hyperactivity disorder, unspecified type: Secondary | ICD-10-CM | POA: Diagnosis not present

## 2024-01-08 DIAGNOSIS — F909 Attention-deficit hyperactivity disorder, unspecified type: Secondary | ICD-10-CM | POA: Diagnosis not present

## 2024-01-08 DIAGNOSIS — F840 Autistic disorder: Secondary | ICD-10-CM | POA: Diagnosis not present

## 2024-01-08 DIAGNOSIS — F3164 Bipolar disorder, current episode mixed, severe, with psychotic features: Secondary | ICD-10-CM | POA: Diagnosis not present

## 2024-01-08 DIAGNOSIS — F4312 Post-traumatic stress disorder, chronic: Secondary | ICD-10-CM | POA: Diagnosis not present

## 2024-01-10 DIAGNOSIS — R6889 Other general symptoms and signs: Secondary | ICD-10-CM | POA: Diagnosis not present

## 2024-01-11 DIAGNOSIS — F909 Attention-deficit hyperactivity disorder, unspecified type: Secondary | ICD-10-CM | POA: Diagnosis not present

## 2024-01-11 DIAGNOSIS — F840 Autistic disorder: Secondary | ICD-10-CM | POA: Diagnosis not present

## 2024-01-11 DIAGNOSIS — F3164 Bipolar disorder, current episode mixed, severe, with psychotic features: Secondary | ICD-10-CM | POA: Diagnosis not present

## 2024-01-11 DIAGNOSIS — F4312 Post-traumatic stress disorder, chronic: Secondary | ICD-10-CM | POA: Diagnosis not present

## 2024-01-15 DIAGNOSIS — E221 Hyperprolactinemia: Secondary | ICD-10-CM | POA: Diagnosis not present

## 2024-01-15 DIAGNOSIS — F419 Anxiety disorder, unspecified: Secondary | ICD-10-CM | POA: Diagnosis not present

## 2024-01-15 DIAGNOSIS — F4312 Post-traumatic stress disorder, chronic: Secondary | ICD-10-CM | POA: Diagnosis not present

## 2024-01-15 DIAGNOSIS — F3164 Bipolar disorder, current episode mixed, severe, with psychotic features: Secondary | ICD-10-CM | POA: Diagnosis not present

## 2024-01-15 DIAGNOSIS — F840 Autistic disorder: Secondary | ICD-10-CM | POA: Diagnosis not present

## 2024-01-15 DIAGNOSIS — F1220 Cannabis dependence, uncomplicated: Secondary | ICD-10-CM | POA: Diagnosis not present

## 2024-01-15 DIAGNOSIS — G4700 Insomnia, unspecified: Secondary | ICD-10-CM | POA: Diagnosis not present

## 2024-01-15 DIAGNOSIS — F909 Attention-deficit hyperactivity disorder, unspecified type: Secondary | ICD-10-CM | POA: Diagnosis not present

## 2024-01-17 DIAGNOSIS — E1165 Type 2 diabetes mellitus with hyperglycemia: Secondary | ICD-10-CM | POA: Diagnosis not present

## 2024-01-17 DIAGNOSIS — Z794 Long term (current) use of insulin: Secondary | ICD-10-CM | POA: Diagnosis not present

## 2024-01-18 DIAGNOSIS — F840 Autistic disorder: Secondary | ICD-10-CM | POA: Diagnosis not present

## 2024-01-18 DIAGNOSIS — F4312 Post-traumatic stress disorder, chronic: Secondary | ICD-10-CM | POA: Diagnosis not present

## 2024-01-18 DIAGNOSIS — F909 Attention-deficit hyperactivity disorder, unspecified type: Secondary | ICD-10-CM | POA: Diagnosis not present

## 2024-01-18 DIAGNOSIS — F3164 Bipolar disorder, current episode mixed, severe, with psychotic features: Secondary | ICD-10-CM | POA: Diagnosis not present

## 2024-01-22 DIAGNOSIS — F3164 Bipolar disorder, current episode mixed, severe, with psychotic features: Secondary | ICD-10-CM | POA: Diagnosis not present

## 2024-01-28 DIAGNOSIS — F840 Autistic disorder: Secondary | ICD-10-CM | POA: Diagnosis not present

## 2024-01-29 DIAGNOSIS — G4700 Insomnia, unspecified: Secondary | ICD-10-CM | POA: Diagnosis not present

## 2024-01-29 DIAGNOSIS — F4312 Post-traumatic stress disorder, chronic: Secondary | ICD-10-CM | POA: Diagnosis not present

## 2024-01-29 DIAGNOSIS — F419 Anxiety disorder, unspecified: Secondary | ICD-10-CM | POA: Diagnosis not present

## 2024-01-29 DIAGNOSIS — F3164 Bipolar disorder, current episode mixed, severe, with psychotic features: Secondary | ICD-10-CM | POA: Diagnosis not present

## 2024-01-29 DIAGNOSIS — F909 Attention-deficit hyperactivity disorder, unspecified type: Secondary | ICD-10-CM | POA: Diagnosis not present

## 2024-01-29 DIAGNOSIS — E221 Hyperprolactinemia: Secondary | ICD-10-CM | POA: Diagnosis not present

## 2024-01-29 DIAGNOSIS — F840 Autistic disorder: Secondary | ICD-10-CM | POA: Diagnosis not present

## 2024-01-29 DIAGNOSIS — F1220 Cannabis dependence, uncomplicated: Secondary | ICD-10-CM | POA: Diagnosis not present

## 2024-01-31 DIAGNOSIS — F840 Autistic disorder: Secondary | ICD-10-CM | POA: Diagnosis not present

## 2024-02-01 DIAGNOSIS — F4312 Post-traumatic stress disorder, chronic: Secondary | ICD-10-CM | POA: Diagnosis not present

## 2024-02-01 DIAGNOSIS — F909 Attention-deficit hyperactivity disorder, unspecified type: Secondary | ICD-10-CM | POA: Diagnosis not present

## 2024-02-01 DIAGNOSIS — F3164 Bipolar disorder, current episode mixed, severe, with psychotic features: Secondary | ICD-10-CM | POA: Diagnosis not present

## 2024-02-01 DIAGNOSIS — F840 Autistic disorder: Secondary | ICD-10-CM | POA: Diagnosis not present

## 2024-02-05 DIAGNOSIS — F3164 Bipolar disorder, current episode mixed, severe, with psychotic features: Secondary | ICD-10-CM | POA: Diagnosis not present

## 2024-02-05 DIAGNOSIS — F4312 Post-traumatic stress disorder, chronic: Secondary | ICD-10-CM | POA: Diagnosis not present

## 2024-02-05 DIAGNOSIS — F840 Autistic disorder: Secondary | ICD-10-CM | POA: Diagnosis not present

## 2024-02-05 DIAGNOSIS — F909 Attention-deficit hyperactivity disorder, unspecified type: Secondary | ICD-10-CM | POA: Diagnosis not present

## 2024-02-07 DIAGNOSIS — F840 Autistic disorder: Secondary | ICD-10-CM | POA: Diagnosis not present

## 2024-02-08 DIAGNOSIS — F909 Attention-deficit hyperactivity disorder, unspecified type: Secondary | ICD-10-CM | POA: Diagnosis not present

## 2024-02-08 DIAGNOSIS — F840 Autistic disorder: Secondary | ICD-10-CM | POA: Diagnosis not present

## 2024-02-08 DIAGNOSIS — F4312 Post-traumatic stress disorder, chronic: Secondary | ICD-10-CM | POA: Diagnosis not present

## 2024-02-08 DIAGNOSIS — F3164 Bipolar disorder, current episode mixed, severe, with psychotic features: Secondary | ICD-10-CM | POA: Diagnosis not present

## 2024-02-12 DIAGNOSIS — E221 Hyperprolactinemia: Secondary | ICD-10-CM | POA: Diagnosis not present

## 2024-02-12 DIAGNOSIS — F1220 Cannabis dependence, uncomplicated: Secondary | ICD-10-CM | POA: Diagnosis not present

## 2024-02-12 DIAGNOSIS — F419 Anxiety disorder, unspecified: Secondary | ICD-10-CM | POA: Diagnosis not present

## 2024-02-12 DIAGNOSIS — G4700 Insomnia, unspecified: Secondary | ICD-10-CM | POA: Diagnosis not present

## 2024-02-13 DIAGNOSIS — F840 Autistic disorder: Secondary | ICD-10-CM | POA: Diagnosis not present

## 2024-02-13 DIAGNOSIS — F909 Attention-deficit hyperactivity disorder, unspecified type: Secondary | ICD-10-CM | POA: Diagnosis not present

## 2024-02-13 DIAGNOSIS — F4312 Post-traumatic stress disorder, chronic: Secondary | ICD-10-CM | POA: Diagnosis not present

## 2024-02-13 DIAGNOSIS — F3164 Bipolar disorder, current episode mixed, severe, with psychotic features: Secondary | ICD-10-CM | POA: Diagnosis not present

## 2024-02-14 DIAGNOSIS — F840 Autistic disorder: Secondary | ICD-10-CM | POA: Diagnosis not present

## 2024-02-15 DIAGNOSIS — F4312 Post-traumatic stress disorder, chronic: Secondary | ICD-10-CM | POA: Diagnosis not present

## 2024-02-15 DIAGNOSIS — F3164 Bipolar disorder, current episode mixed, severe, with psychotic features: Secondary | ICD-10-CM | POA: Diagnosis not present

## 2024-02-15 DIAGNOSIS — F840 Autistic disorder: Secondary | ICD-10-CM | POA: Diagnosis not present

## 2024-02-15 DIAGNOSIS — F909 Attention-deficit hyperactivity disorder, unspecified type: Secondary | ICD-10-CM | POA: Diagnosis not present

## 2024-02-19 DIAGNOSIS — F4312 Post-traumatic stress disorder, chronic: Secondary | ICD-10-CM | POA: Diagnosis not present

## 2024-02-19 DIAGNOSIS — F3164 Bipolar disorder, current episode mixed, severe, with psychotic features: Secondary | ICD-10-CM | POA: Diagnosis not present

## 2024-02-19 DIAGNOSIS — F909 Attention-deficit hyperactivity disorder, unspecified type: Secondary | ICD-10-CM | POA: Diagnosis not present

## 2024-02-19 DIAGNOSIS — F840 Autistic disorder: Secondary | ICD-10-CM | POA: Diagnosis not present

## 2024-02-21 DIAGNOSIS — F840 Autistic disorder: Secondary | ICD-10-CM | POA: Diagnosis not present

## 2024-02-21 DIAGNOSIS — F909 Attention-deficit hyperactivity disorder, unspecified type: Secondary | ICD-10-CM | POA: Diagnosis not present

## 2024-02-21 DIAGNOSIS — F3164 Bipolar disorder, current episode mixed, severe, with psychotic features: Secondary | ICD-10-CM | POA: Diagnosis not present

## 2024-02-21 DIAGNOSIS — F4312 Post-traumatic stress disorder, chronic: Secondary | ICD-10-CM | POA: Diagnosis not present

## 2024-02-26 DIAGNOSIS — F840 Autistic disorder: Secondary | ICD-10-CM | POA: Diagnosis not present

## 2024-02-26 DIAGNOSIS — N179 Acute kidney failure, unspecified: Secondary | ICD-10-CM | POA: Diagnosis not present

## 2024-02-26 DIAGNOSIS — I10 Essential (primary) hypertension: Secondary | ICD-10-CM | POA: Diagnosis not present

## 2024-02-27 DIAGNOSIS — T7431XA Adult psychological abuse, confirmed, initial encounter: Secondary | ICD-10-CM | POA: Diagnosis not present

## 2024-02-27 DIAGNOSIS — H903 Sensorineural hearing loss, bilateral: Secondary | ICD-10-CM | POA: Diagnosis not present

## 2024-03-05 DIAGNOSIS — F3164 Bipolar disorder, current episode mixed, severe, with psychotic features: Secondary | ICD-10-CM | POA: Diagnosis not present

## 2024-03-05 DIAGNOSIS — F4312 Post-traumatic stress disorder, chronic: Secondary | ICD-10-CM | POA: Diagnosis not present

## 2024-03-05 DIAGNOSIS — F909 Attention-deficit hyperactivity disorder, unspecified type: Secondary | ICD-10-CM | POA: Diagnosis not present

## 2024-03-05 DIAGNOSIS — F840 Autistic disorder: Secondary | ICD-10-CM | POA: Diagnosis not present

## 2024-03-07 DIAGNOSIS — F3164 Bipolar disorder, current episode mixed, severe, with psychotic features: Secondary | ICD-10-CM | POA: Diagnosis not present

## 2024-03-07 DIAGNOSIS — F909 Attention-deficit hyperactivity disorder, unspecified type: Secondary | ICD-10-CM | POA: Diagnosis not present

## 2024-03-07 DIAGNOSIS — F840 Autistic disorder: Secondary | ICD-10-CM | POA: Diagnosis not present

## 2024-03-07 DIAGNOSIS — F4312 Post-traumatic stress disorder, chronic: Secondary | ICD-10-CM | POA: Diagnosis not present

## 2024-03-11 DIAGNOSIS — G4700 Insomnia, unspecified: Secondary | ICD-10-CM | POA: Diagnosis not present

## 2024-03-11 DIAGNOSIS — F1220 Cannabis dependence, uncomplicated: Secondary | ICD-10-CM | POA: Diagnosis not present

## 2024-03-11 DIAGNOSIS — F419 Anxiety disorder, unspecified: Secondary | ICD-10-CM | POA: Diagnosis not present

## 2024-03-11 DIAGNOSIS — E221 Hyperprolactinemia: Secondary | ICD-10-CM | POA: Diagnosis not present

## 2024-03-12 DIAGNOSIS — Z01419 Encounter for gynecological examination (general) (routine) without abnormal findings: Secondary | ICD-10-CM | POA: Diagnosis not present

## 2024-03-14 DIAGNOSIS — F4312 Post-traumatic stress disorder, chronic: Secondary | ICD-10-CM | POA: Diagnosis not present

## 2024-03-14 DIAGNOSIS — F840 Autistic disorder: Secondary | ICD-10-CM | POA: Diagnosis not present

## 2024-03-14 DIAGNOSIS — F3164 Bipolar disorder, current episode mixed, severe, with psychotic features: Secondary | ICD-10-CM | POA: Diagnosis not present

## 2024-03-14 DIAGNOSIS — F909 Attention-deficit hyperactivity disorder, unspecified type: Secondary | ICD-10-CM | POA: Diagnosis not present

## 2024-03-18 DIAGNOSIS — F909 Attention-deficit hyperactivity disorder, unspecified type: Secondary | ICD-10-CM | POA: Diagnosis not present

## 2024-03-18 DIAGNOSIS — F4312 Post-traumatic stress disorder, chronic: Secondary | ICD-10-CM | POA: Diagnosis not present

## 2024-03-18 DIAGNOSIS — F3164 Bipolar disorder, current episode mixed, severe, with psychotic features: Secondary | ICD-10-CM | POA: Diagnosis not present

## 2024-03-18 DIAGNOSIS — F840 Autistic disorder: Secondary | ICD-10-CM | POA: Diagnosis not present

## 2024-03-21 DIAGNOSIS — F909 Attention-deficit hyperactivity disorder, unspecified type: Secondary | ICD-10-CM | POA: Diagnosis not present

## 2024-03-21 DIAGNOSIS — F4312 Post-traumatic stress disorder, chronic: Secondary | ICD-10-CM | POA: Diagnosis not present

## 2024-03-21 DIAGNOSIS — F840 Autistic disorder: Secondary | ICD-10-CM | POA: Diagnosis not present

## 2024-03-21 DIAGNOSIS — F3164 Bipolar disorder, current episode mixed, severe, with psychotic features: Secondary | ICD-10-CM | POA: Diagnosis not present

## 2024-03-25 DIAGNOSIS — F840 Autistic disorder: Secondary | ICD-10-CM | POA: Diagnosis not present

## 2024-03-28 DIAGNOSIS — F840 Autistic disorder: Secondary | ICD-10-CM | POA: Diagnosis not present

## 2024-04-01 DIAGNOSIS — F840 Autistic disorder: Secondary | ICD-10-CM | POA: Diagnosis not present

## 2024-04-04 DIAGNOSIS — F840 Autistic disorder: Secondary | ICD-10-CM | POA: Diagnosis not present

## 2024-04-08 DIAGNOSIS — N946 Dysmenorrhea, unspecified: Secondary | ICD-10-CM | POA: Diagnosis not present

## 2024-04-08 DIAGNOSIS — Z800 Family history of malignant neoplasm of digestive organs: Secondary | ICD-10-CM | POA: Diagnosis not present

## 2024-04-08 DIAGNOSIS — Z8049 Family history of malignant neoplasm of other genital organs: Secondary | ICD-10-CM | POA: Diagnosis not present

## 2024-04-08 DIAGNOSIS — I951 Orthostatic hypotension: Secondary | ICD-10-CM | POA: Diagnosis not present

## 2024-04-08 DIAGNOSIS — Z808 Family history of malignant neoplasm of other organs or systems: Secondary | ICD-10-CM | POA: Diagnosis not present

## 2024-04-08 DIAGNOSIS — F840 Autistic disorder: Secondary | ICD-10-CM | POA: Diagnosis not present

## 2024-04-08 DIAGNOSIS — Z803 Family history of malignant neoplasm of breast: Secondary | ICD-10-CM | POA: Diagnosis not present

## 2024-04-11 DIAGNOSIS — F840 Autistic disorder: Secondary | ICD-10-CM | POA: Diagnosis not present

## 2024-04-16 DIAGNOSIS — R112 Nausea with vomiting, unspecified: Secondary | ICD-10-CM | POA: Diagnosis not present

## 2024-04-16 DIAGNOSIS — R198 Other specified symptoms and signs involving the digestive system and abdomen: Secondary | ICD-10-CM | POA: Diagnosis not present

## 2024-04-16 DIAGNOSIS — K5904 Chronic idiopathic constipation: Secondary | ICD-10-CM | POA: Diagnosis not present

## 2024-04-16 DIAGNOSIS — K219 Gastro-esophageal reflux disease without esophagitis: Secondary | ICD-10-CM | POA: Diagnosis not present

## 2024-04-17 DIAGNOSIS — F840 Autistic disorder: Secondary | ICD-10-CM | POA: Diagnosis not present

## 2024-04-18 DIAGNOSIS — F840 Autistic disorder: Secondary | ICD-10-CM | POA: Diagnosis not present

## 2024-04-19 DIAGNOSIS — G4700 Insomnia, unspecified: Secondary | ICD-10-CM | POA: Diagnosis not present

## 2024-04-19 DIAGNOSIS — F1220 Cannabis dependence, uncomplicated: Secondary | ICD-10-CM | POA: Diagnosis not present

## 2024-04-19 DIAGNOSIS — E221 Hyperprolactinemia: Secondary | ICD-10-CM | POA: Diagnosis not present

## 2024-04-19 DIAGNOSIS — F419 Anxiety disorder, unspecified: Secondary | ICD-10-CM | POA: Diagnosis not present

## 2024-04-22 DIAGNOSIS — F909 Attention-deficit hyperactivity disorder, unspecified type: Secondary | ICD-10-CM | POA: Diagnosis not present

## 2024-04-23 DIAGNOSIS — G629 Polyneuropathy, unspecified: Secondary | ICD-10-CM | POA: Diagnosis not present

## 2024-04-25 DIAGNOSIS — F909 Attention-deficit hyperactivity disorder, unspecified type: Secondary | ICD-10-CM | POA: Diagnosis not present

## 2024-04-29 DIAGNOSIS — F840 Autistic disorder: Secondary | ICD-10-CM | POA: Diagnosis not present

## 2024-04-30 ENCOUNTER — Inpatient Hospital Stay: Attending: Hematology

## 2024-04-30 DIAGNOSIS — Z803 Family history of malignant neoplasm of breast: Secondary | ICD-10-CM | POA: Diagnosis not present

## 2024-04-30 DIAGNOSIS — Z8049 Family history of malignant neoplasm of other genital organs: Secondary | ICD-10-CM

## 2024-04-30 DIAGNOSIS — Z1379 Encounter for other screening for genetic and chromosomal anomalies: Secondary | ICD-10-CM | POA: Diagnosis not present

## 2024-04-30 DIAGNOSIS — Z800 Family history of malignant neoplasm of digestive organs: Secondary | ICD-10-CM

## 2024-04-30 DIAGNOSIS — Z808 Family history of malignant neoplasm of other organs or systems: Secondary | ICD-10-CM

## 2024-05-02 DIAGNOSIS — Z808 Family history of malignant neoplasm of other organs or systems: Secondary | ICD-10-CM | POA: Insufficient documentation

## 2024-05-02 DIAGNOSIS — Z8049 Family history of malignant neoplasm of other genital organs: Secondary | ICD-10-CM | POA: Insufficient documentation

## 2024-05-02 DIAGNOSIS — Z800 Family history of malignant neoplasm of digestive organs: Secondary | ICD-10-CM | POA: Insufficient documentation

## 2024-05-02 DIAGNOSIS — Z803 Family history of malignant neoplasm of breast: Secondary | ICD-10-CM | POA: Insufficient documentation

## 2024-05-02 DIAGNOSIS — Z1379 Encounter for other screening for genetic and chromosomal anomalies: Secondary | ICD-10-CM | POA: Insufficient documentation

## 2024-05-02 NOTE — Progress Notes (Signed)
 REFERRING PROVIDER: Claire Rubie LABOR, MD [address] [place],  [place] [address]  PRIMARY PROVIDER:  No primary care provider on file.  PRIMARY REASON FOR VISIT:  1. Genetic testing   2. Family history of uterine cancer   3. Family history of pancreatic cancer   4. Family history of melanoma   5. Family history of esophageal cancer   6. Family history of colon cancer   7. Family history of breast cancer      HISTORY OF PRESENT ILLNESS:   I connected with Alyssa Sanford on 04/30/24 at 1pm EDT by video conference and verified that I am speaking with the correct person using three identifiers.   Patient location: [place], [place] Provider location: [place], [place]  Alyssa Sanford, a 28 y.o. female, was seen on 04/30/2024 for a [facility] cancer genetics consultation for a video visit at the request of Dr. Claire due to negative genetic testing in the setting of a family history of cancer. Alyssa Sanford had genetic testing through [facility], report date 04/19/2024, results were negative. [facility] calculated her Tyrer-Cuzick score as 26.4%.  Alyssa Sanford presents to clinic today to discuss these results and their implications for herself and her family.   CANCER HISTORY:  Oncology History   No history exists.    [name] is a 28 y.o. female with no personal history of cancer.    RELEVANT MEDICAL HISTORY AND RISK FACTORS:  Menarche was at age 39.  First live birth at age N/A.  OCP use for approximately 9 years.  Ovaries intact: yes.  Hysterectomy: no.  Menopausal status: premenopausal.  HRT use: 0 years. Mammogram within the last year: no. Has history of breast ultrasound in 2011 due to asymmetrical breast enlargement Breast density: unknown Number of breast biopsies: 0. Up to date with pelvic exams: yes. Colonoscopy: yes; underwent colonoscopy in 2021 due to GI troubles, significant for tubular adenoma with 5 year follow-up. Had another colonoscopy a few months ago per  patient and she reports no polyps were found and she was due back in 2029. Any excessive radiation exposure in the past: no Other cancer screenings: no.  Exposures: no.   Past Medical History:  Diagnosis Date   ADHD    Bipolar 1 disorder (HCC)    Depression with anxiety    DM (diabetes mellitus) (HCC)    Family history of breast cancer    Family history of colon cancer    Family history of esophageal cancer    Family history of melanoma    Family history of pancreatic cancer    Family history of uterine cancer    Genetic testing    Negative My[facility] MyRisk. Report date 04/19/2024.   Hypertension    Impaired glucose tolerance    Strep throat    Tachyarrhythmia     Past Surgical History:  Procedure Laterality Date   AUGMENTATION MAMMAPLASTY Left 2018   saline implant only on left    Social History   Socioeconomic History   Marital status: Single    Spouse name: Not on file   Number of children: Not on file   Years of education: Not on file   Highest education level: Not on file  Occupational History   Not on file  Tobacco Use   Smoking status: Every Day    Types: Cigarettes   Smokeless tobacco: Not on file  Substance and Sexual Activity   Alcohol use: Yes   Drug use: No   Sexual activity:  Yes    Birth control/protection: Pill  Other Topics Concern   Not on file  Social History Narrative   Not on file   Social Drivers of Health   Financial Resource Strain: Not on file  Food Insecurity: Low Risk  (01/17/2024)   Received from [facility]   Hunger Vital Sign    Within the past 12 months, you worried that your food would run out before you got money to buy more: Never true    Within the past 12 months, the food you bought just didn't last and you didn't have money to get more. : Never true  Transportation Needs: No Transportation Needs (01/17/2024)   Received from [facility]    In the past 12 months, has lack of reliable transportation kept  you from medical appointments, meetings, work or from getting things needed for daily living? : No  Physical Activity: Not on file  Stress: Not on file  Social Connections: Unknown (11/15/2021)   Received from [facility]   Social Network    Social Network: Not on file     FAMILY HISTORY:  We obtained a detailed, 4-generation family history.  Significant diagnoses are listed below: Family History  Problem Relation Age of Onset   Healthy Mother    Healthy Father    Breast cancer Maternal Grandmother     Alyssa Sanford mother was diagnosed with pancreatic cancer at age 66. She reports her mother reportedly had negative genetic testing.  Her maternal aunt was diagnosed with melanoma of the knee at 46 and melanoma on the elbow at 7 Her maternal aunt was diagnosed with uterine cancer at 33. This aunt's daughter was diagnosed with melanoma at 62 and had a hysterectomy due to her mother's uterine cancer diagnosis.  Her maternal grandmother was diagnosed with breast cancer at 67 and again at 48, uterine cancer at 80, and melanoma at 3. She reports her maternal grandmother had multiple melanomas during her life.  Her maternal grandfather had aggressive melanoma at age 33 which she believes metastasized to his lungs.  Her maternal great grandmother was diagnosed with breast cancer at age 35 Her maternal great grandfather was diagnosed with colon cancer at age 58 Her maternal great great grandmother and great great great grandmother were both diagnosed with breast cancer.   There is no reported Ashkenazi Jewish ancestry. There is no known consanguinity.  GENETIC COUNSELING ASSESSMENT:   Alyssa Sanford is a 28 y.o. female with a family history of breast, melanoma, uterine, and colon cancer. She had genetic testing done through [facility]. The [facility] gene panel offered by [facility] includes analysis of the following 48 genes: APC, ATM, AXIN2, BAP1, BARD1, BMPR1A, BRCA1,  BRCA2, BRIP1, CHD1, CDK4, CDKN2A, CHEK2, CTNNA1, EGFR, EPCAM, FH, FLCN, GREM1, HOXB13, MEN1, MET, MITF, MLH1, MSH2, MSH3, MSH6, MUTYH, NTHL1, PALB2, PMS2, POLD1, POLE, PTEN, RAD51C, RAD51D, RET, SDHA, SDHB, SDHC, SDHD, SMAD4, STK11, TERT, TP53, TSC1, TSC2, and VHL. This cancer panel found no pathogenic mutations. [facility] calculated her Tyrer-Cuzick score as 26.4% Genetic testing reported out on 04/19/24. The test report can be found in Media tab of EPIC.  A portion of the result report is included below for reference. We, therefore, discussed and recommended the following at today's visit.     DISCUSSION:   We discussed that Alyssa Sanford genetic testing results were negative, meaning out of the 48 gene analyzed, there were no genetic mutations that increase cancer risk that were identified.  We discussed that, in general, most cancer is not inherited in families, but instead is sporadic or familial. Only 5-10% of cancer is hereditary. Sporadic cancers occur by chance and typically happen at older ages (>50 years) as this type of cancer is caused by genetic changes acquired during an individual's lifetime. Some families have more cancers than would be expected by chance. This type of familial cancer is thought to be due to a combination of multiple genetic, environmental, hormonal, and lifestyle factors. While this combination of factors likely increases the risk of cancer, the exact source of this familial risk is not currently identifiable or testable. We discussed that it is possible her family history of cancer falls into this familial category.   We discussed that given the family history of cancer that has not yet be explained by heritable factors, she may be at an increased risk for breast cancer and melanoma based on family history alone. Please see below for additional discussion and recommendations about this.  CANCER SCREENING RECOMMENDATIONS: Alyssa Sanford test result is considered negative  (normal).  This means that we have not identified a hereditary cause for her family history of cancer's at this time. Most cancers happen by chance and this negative test suggests that her family history of cancer may fall into this category.    Possible reasons for Alyssa Sanford negative genetic test include:  1. There may be a gene mutation in one of these genes that current testing methods cannot detect but that chance is small.  2. There could be another gene that has not yet been discovered, or that we have not yet tested, that is responsible for the cancer diagnoses in the family.  3.  There may be no hereditary risk for cancer in the family. The cancers in Alyssa Sanford and/or her family may be sporadic/familial or due to other genetic and environmental factors. 4. It is also possible there is a hereditary cause for the cancer in the family that Alyssa Sanford did not inherit.  An individual's cancer risk and medical management are not determined by genetic test results alone. Overall cancer risk assessment incorporates additional factors, including personal medical history, family history, and any available genetic information that may result in a personalized plan for cancer prevention and surveillance.The Tyrer-Cuzick model is one of multiple prediction models developed to estimate an individual's lifetime risk of developing breast cancer. The Tyrer-Cuzick model is endorsed by the [employer] (NCCN). This model includes many risk factors such as family history, endogenous estrogen exposure, and benign breast disease. The calculation is highly-dependent on the accuracy of clinical data provided by the patient and can change over time. The Tyrer-Cuzick model may be repeated to reflect new information in her personal or family history in the future. No other risk models to estimate lifetime risk for other cancer's were applicable and/or available.   [facility] calculated Ms.  Sanford Tyrer Cuzick score to be 26.4% based on information at the time of her genetic testing. Please see below for their calculation. We calculated her risk to be 19.8%. Please see below for our calculation.         Alyssa Sanford has been determined to be at high risk for breast cancer. [facility] calculated her Beatrice Sanford score to be 26.4% based on information at the time of her genetic testing. We recalculated her risk to be 19.8%. Her lifetime risk for breast cancer likely falls somewhere in between these numbers. For women with a greater  than 20% lifetime risk of breast cancer, the [employer] (NCCN) recommends the following:   1.     Clinical encounter every 6-12 months to begin when identified as being at increased risk, but not before age 70  2.      Annual mammograms. Tomosynthesis is recommended starting 10 years earlier than the youngest breast cancer diagnosis in the family or at age 67 (whichever comes first), but not before age 53    52.     Annual breast MRI starting 10 years earlier than the youngest breast cancer diagnosis in the family or at age 58 (whichever comes first), but not before age 43.   We, therefore, discussed that it is reasonable for Alyssa Sanford to be followed by a high-risk breast cancer clinic; in addition to a yearly mammogram and physical exam by a healthcare provider, she should discuss the usefulness of an annual breast MRI with the high-risk clinic providers.  We have made a referral to the [facility].  Given the family history of melanoma, she may be at an increased risk for melanoma. Therefore, we also recommended regular skin self exams, wearing sunscreen and sun protective clothing when outside, limiting sun and UV radiation exposure, and having annual clinical skin exams. It is important that Alyssa Sanford also make her primary care providers aware of this family history.   RECOMMENDATIONS FOR FAMILY MEMBERS:    Since she did not inherit a identifiable mutation in a cancer predisposition gene included on this panel, any future children could not have inherited a known mutation from her in one of these genes. Individuals in this family might be at some increased risk of developing cancer, over the general population risk, simply due to the family history of cancer.  We recommended women in this family have a yearly mammogram beginning at age 54, or 62 years younger than the earliest onset of cancer, an annual clinical breast exam, and perform monthly breast self-exams. Women in this family should also have a gynecological exam as recommended by their primary provider. All family members should be referred for colonoscopy starting at age 50, or 57 years younger than the earliest onset of cancer. It is also possible there is a hereditary cause for the cancer in Alyssa Sanford family that she did not inherit and therefore was not identified in her.  Based on Ms. Delmore's family history, we recommended her maternal aunt, her maternal cousin's consider genetic counseling and testing. Ms. Alessandrini will let us  know if we can be of any assistance in coordinating genetic counseling and/or testing for this family member.   ADDITIONAL GENETIC TESTING: We discussed with Ms. Dossett that her genetic testing was fairly extensive.  If there are genes identified to increase cancer risk that can be analyzed in the future, we would be happy to discuss and coordinate this testing at that time.    FOLLOW-UP: Lastly, we discussed with Ms. Genna that cancer genetics is a rapidly advancing field and it is possible that new genetic tests will be appropriate for her and/or her family members in the future. We encouraged her to remain in contact with cancer genetics on an annual basis so we can update her personal and family histories and let her know of advances in cancer genetics that may benefit this family.   Our contact number  was provided. Ms. Nellums questions were answered to her satisfaction, and her knows her is welcome to call us  at anytime  with additional questions or concerns.   RESOURCES PROVIDED:  Ms. Corrow was provided with the following:  [facility] Contact information   Lastly, we encouraged Ms. Mcconahy to remain in contact with cancer genetics annually so that we can continuously update the family history and inform her of any changes in cancer genetics and testing that may be of benefit for this family.   Ms. Gradel questions were answered to her satisfaction today. Our contact information was provided should additional questions or concerns arise. Thank you for the referral and allowing us  to share in the care of your patient.   Nealie Mchatton R. Bluford, MS, [facility] Certified [email or web address] phone: [phone]  I personally spent a total of 65 minutes in the care of the patient today including preparing to see the patient, getting/reviewing separately obtained history, counseling and educating, referring and communicating with other health care professionals, documenting clinical information in the EHR, and communicating results.  The patient was seen alone.   Drs. Lanny Stalls, and/or Gudena were available for questions, if needed.   _______________________________________________________________________ For Office Staff:  Number of people involved in session: 1 Was an Intern/ student involved with case: no

## 2024-05-06 DIAGNOSIS — F840 Autistic disorder: Secondary | ICD-10-CM | POA: Diagnosis not present

## 2024-05-09 DIAGNOSIS — F840 Autistic disorder: Secondary | ICD-10-CM | POA: Diagnosis not present

## 2024-05-10 ENCOUNTER — Ambulatory Visit

## 2024-05-10 ENCOUNTER — Ambulatory Visit
Attending: Student in an Organized Health Care Education/Training Program | Admitting: Student in an Organized Health Care Education/Training Program

## 2024-05-10 VITALS — BP 122/70 | HR 96 | Ht 64.0 in | Wt 141.0 lb

## 2024-05-10 DIAGNOSIS — R000 Tachycardia, unspecified: Secondary | ICD-10-CM

## 2024-05-10 DIAGNOSIS — I10 Essential (primary) hypertension: Secondary | ICD-10-CM | POA: Diagnosis not present

## 2024-05-10 NOTE — Patient Instructions (Signed)
 Medication Instructions:  Your physician recommends that you continue on your current medications as directed. Please refer to the Current Medication list given to you today.  *If you need a refill on your cardiac medications before your next appointment, please call your pharmacy*  Lab Work: TODAY: CBC, TSH If you have labs (blood work) drawn today and your tests are completely normal, you will receive your results only by: MyChart Message (if you have MyChart) OR A paper copy in the mail If you have any lab test that is abnormal or we need to change your treatment, we will call you to review the results.  Testing/Procedures: NONE  Follow-Up: At [facility], you and your health needs are our priority.  As part of our continuing mission to provide you with exceptional heart care, our providers are all part of one team.  This team includes your primary Cardiologist (physician) and Advanced Practice Providers or APPs (Physician Assistants and Nurse Practitioners) who all work together to provide you with the care you need, when you need it.  Your next appointment:   As Needed  We recommend signing up for the patient portal called MyChart.  Sign up information is provided on this After Visit Summary.  MyChart is used to connect with patients for Virtual Visits (Telemedicine).  Patients are able to view lab/test results, encounter notes, upcoming appointments, etc.  Non-urgent messages can be sent to your provider as well.   To learn more about what you can do with MyChart, go to [email or web address].   Other Instructions ZIO XT- Long Term Monitor Instructions  Your physician has requested you wear a ZIO patch monitor for ___7_ days.   This is a single patch monitor. [employer] supplies one patch monitor per enrollment. Additional  stickers are not available. Please do not apply patch if you will be having a Nuclear Stress Test,  Echocardiogram, Cardiac CT, MRI, or Chest Xray  during the period you would be wearing the  monitor. The patch cannot be worn during these tests. You cannot remove and re-apply the  ZIO XT patch monitor.   Your ZIO patch monitor will be mailed 3 day USPS to your address on file. It may take 3-5 days  to receive your monitor after you have been enrolled.   Once you have received your monitor, please review the enclosed instructions. Your monitor  has already been registered assigning a specific monitor serial # to you.     Billing and Patient Assistance Program Information  We have supplied [employer] with any of your insurance information on file for billing purposes.  [employer] offers a sliding scale Patient Assistance Program for patients that do not have  insurance, or whose insurance does not completely cover the cost of the ZIO monitor.  You must apply for the Patient Assistance Program to qualify for this discounted rate.   To apply, please call [employer] at [phone], select option 4, select option 2, ask to apply for  Patient Assistance Program. [facility] will ask your household income, and how many people  are in your household. They will quote your out-of-pocket cost based on that information.  [employer] will also be able to set up a 33-month, interest-free payment plan if needed.     Applying the monitor  Shave hair from upper left chest.  Hold abrader disc by orange tab. Rub abrader in 40 strokes over the upper left chest as  indicated in your monitor instructions.  Clean area with 4 enclosed alcohol  pads. Let dry.  Apply patch as indicated in monitor instructions. Patch will be placed under collarbone on left  side of chest with arrow pointing upward.  Rub patch adhesive wings for 2 minutes. Remove white label marked 1. Remove the white  label marked 2. Rub patch adhesive wings for 2 additional minutes.  While looking in a mirror, press and release button in center of patch. A small green light will  flash 3-4 times. This  will be your only indicator that the monitor has been turned on.  Do not shower for the first 24 hours. You may shower after the first 24 hours.  Press the button if you feel a symptom. You will hear a small click. Record Date, Time and  Symptom in the Patient Logbook.  When you are ready to remove the patch, follow instructions on the last 2 pages of Patient  Logbook. Stick patch monitor onto the last page of Patient Logbook.   Place Patient Logbook in the blue and white box. Use locking tab on box and tape box closed  securely. The blue and white box has prepaid postage on it. Please place it in the mailbox as  soon as possible. Your physician should have your test results approximately 7 days after the  monitor has been mailed back to [facility].   Call [facility] Customer Care at [phone] if you have questions regarding  your ZIO XT patch monitor. Call them immediately if you see an orange light blinking on your  monitor.   If your monitor falls off in less than 4 days, contact our Monitor department at [phone].   If your monitor becomes loose or falls off after 4 days call [employer] at [phone] for  suggestions on securing your monitor.

## 2024-05-10 NOTE — Progress Notes (Unsigned)
 Enrolled patient for a 7 day Zio XT monitor to be mailed to patients home.

## 2024-05-10 NOTE — Progress Notes (Signed)
 Cardiology Office Note:   Date:  05/10/2024  ID:  Alyssa Sanford, DOB 02/26/1996, MRN [id number] PCP: Dayna Motto, DO  [facility] Providers Cardiologist:  Georganna Archer { Chief Complaint:  Chief Complaint  Patient presents with   Palpitations      History of Present Illness:   Alyssa Sanford is a 28 y.o. female with a PMH of HTN, HLD, DM2, vaping, bipolar and anxiety who presents as a new patient referral by Dr. Marlee for evaluation of tachycardia.  Patient states that she has had issues with tachycardia for many years, but it has become worse in recent times.  She states that this year she has had 2 episodes where she felt her heart racing really fast at rest and both episodes occurred in the setting of anxiety/stress.  In April she was laid off and her mother died and she had an episode of tachycardia in the midst of this in October her aunt and uncle visited which was very stressful for her and she had another episode of tachycardia.  She was previously on atenolol  for blood pressure but recently discontinued this.  Now she feels like her heart is racing more often.  She has a pulse oximeter and an Apple watch that she said her heart rate ranges from 90 to the 130s even at rest.  She feels like mostly stress makes it worse though.  She denies chest pain, syncope, PND, orthopnea.  Endorses some occasional dizziness and SOB with the palpitations.  The patient endorses vaping and occasional EtOH use.  Denies illicit drug use.  Family history is noncontributory.   Past Medical History:  Diagnosis Date   ADHD    Bipolar 1 disorder (HCC)    Depression with anxiety    DM (diabetes mellitus) (HCC)    Family history of breast cancer    Family history of colon cancer    Family history of esophageal cancer    Family history of melanoma    Family history of pancreatic cancer    Family history of uterine cancer    Genetic testing    Negative Myriad MyRisk. Report date  04/19/2024.   Hypertension    Impaired glucose tolerance    Strep throat    Tachyarrhythmia      Studies Reviewed:    EKG:  EKG Interpretation Date/Time:  Friday May 10 2024 14:15:33 EST Ventricular Rate:  96 PR Interval:  122 QRS Duration:  86 QT Interval:  340 QTC Calculation: 429 R Axis:   80  Text Interpretation: Normal sinus rhythm Normal ECG No previous ECGs available Confirmed by Archer Georganna [phone]) on 05/10/2024 2:24:20 PM         Risk Assessment/Calculations:              Physical Exam:     VS:  BP 122/70   Pulse 96   Ht 5' 4 (1.626 m)   Wt 141 lb (64 kg)   BMI 24.20 kg/m      Wt Readings from Last 3 Encounters:  05/10/24 141 lb (64 kg)  11/20/22 147 lb 11.3 oz (67 kg)     GEN: Well nourished, well developed, in no acute distress NECK: No JVD; No carotid bruits CARDIAC: RRR, no murmurs, rubs, gallops RESPIRATORY:  Clear to auscultation without rales, wheezing or rhonchi  ABDOMEN: Soft, non-tender, non-distended, normal bowel sounds EXTREMITIES:  Warm and well perfused, no edema; No deformity, 2+ radial pulses PSYCH: Normal mood and affect  Assessment & Plan Tachycardia - Patient has longstanding episodes of tachycardia that have become more pronounced recently.  Usually has episodes have primarily been in the setting of increased anxiety and stress. -I suspect that the patient is experiencing sinus tachycardia in the setting of anxiety. -Will check a 1 week heart monitor though to make sure that she does not have any notable arrhythmias. - I offered the patient propranolol as needed in the event that she does have sustained tachycardia, but we will first see what her heart monitor results show. 1 week heart monitor TSH, CBC Follow-up as needed  Hypertension, unspecified type - Blood pressure is at goal off of treatment.  No changes.          This note was written with the assistance of a dictation microphone or AI dictation  software. Please excuse any typos or grammatical errors.   Signed, Georganna Archer, MD 05/10/2024 2:22 PM    [facility]

## 2024-05-10 NOTE — Assessment & Plan Note (Signed)
-   Blood pressure is at goal off of treatment.  No changes.

## 2024-05-11 LAB — CBC
Hematocrit: 42.8 % (ref 34.0–46.6)
Hemoglobin: 13.9 g/dL (ref 11.1–15.9)
MCH: 29 pg (ref 26.6–33.0)
MCHC: 32.5 g/dL (ref 31.5–35.7)
MCV: 89 fL (ref 79–97)
Platelets: 269 x10E3/uL (ref 150–450)
RBC: 4.79 x10E6/uL (ref 3.77–5.28)
RDW: 13.1 % (ref 11.7–15.4)
WBC: 6.8 x10E3/uL (ref 3.4–10.8)

## 2024-05-11 LAB — TSH+T4F+T3FREE
Free T4: 1.31 ng/dL (ref 0.82–1.77)
T3, Free: 3.1 pg/mL (ref 2.0–4.4)
TSH: 2.4 u[IU]/mL (ref 0.450–4.500)

## 2024-05-13 ENCOUNTER — Ambulatory Visit: Payer: Self-pay | Admitting: Student in an Organized Health Care Education/Training Program

## 2024-05-13 DIAGNOSIS — E221 Hyperprolactinemia: Secondary | ICD-10-CM | POA: Diagnosis not present

## 2024-05-13 DIAGNOSIS — F419 Anxiety disorder, unspecified: Secondary | ICD-10-CM | POA: Diagnosis not present

## 2024-05-13 DIAGNOSIS — F1220 Cannabis dependence, uncomplicated: Secondary | ICD-10-CM | POA: Diagnosis not present

## 2024-05-13 DIAGNOSIS — G4700 Insomnia, unspecified: Secondary | ICD-10-CM | POA: Diagnosis not present

## 2024-05-14 DIAGNOSIS — F840 Autistic disorder: Secondary | ICD-10-CM | POA: Diagnosis not present

## 2024-05-16 DIAGNOSIS — F840 Autistic disorder: Secondary | ICD-10-CM | POA: Diagnosis not present

## 2024-05-21 DIAGNOSIS — E1165 Type 2 diabetes mellitus with hyperglycemia: Secondary | ICD-10-CM | POA: Diagnosis not present

## 2024-05-22 ENCOUNTER — Telehealth: Payer: Self-pay | Admitting: Student in an Organized Health Care Education/Training Program

## 2024-05-22 NOTE — Telephone Encounter (Signed)
 Spoke with patient regarding her heart monitor. Pt instructed to shower prior to placing her monitor and dry thoroughly. Can place monitor today and wear for 7 days. After 7 day period, pt advised to follow instructions and return monitor. Pt verbalizes understanding and opportunities given for questioning.  Thank you,  Powell, RN [facility] Triage

## 2024-05-22 NOTE — Telephone Encounter (Signed)
  Patient is requesting to extend her heart monitor. She haven't wear it yet and she is planning to wear it today but she wants to make sure it's still valid

## 2024-05-23 DIAGNOSIS — F840 Autistic disorder: Secondary | ICD-10-CM | POA: Diagnosis not present

## 2024-05-27 DIAGNOSIS — F840 Autistic disorder: Secondary | ICD-10-CM | POA: Diagnosis not present

## 2024-05-29 DIAGNOSIS — F840 Autistic disorder: Secondary | ICD-10-CM | POA: Diagnosis not present

## 2024-06-03 DIAGNOSIS — G4700 Insomnia, unspecified: Secondary | ICD-10-CM | POA: Diagnosis not present

## 2024-06-03 DIAGNOSIS — E221 Hyperprolactinemia: Secondary | ICD-10-CM | POA: Diagnosis not present

## 2024-06-03 DIAGNOSIS — F419 Anxiety disorder, unspecified: Secondary | ICD-10-CM | POA: Diagnosis not present

## 2024-06-03 DIAGNOSIS — F840 Autistic disorder: Secondary | ICD-10-CM | POA: Diagnosis not present

## 2024-06-03 DIAGNOSIS — F1220 Cannabis dependence, uncomplicated: Secondary | ICD-10-CM | POA: Diagnosis not present

## 2024-06-04 DIAGNOSIS — F909 Attention-deficit hyperactivity disorder, unspecified type: Secondary | ICD-10-CM | POA: Diagnosis not present

## 2024-06-04 DIAGNOSIS — F319 Bipolar disorder, unspecified: Secondary | ICD-10-CM | POA: Diagnosis not present

## 2024-06-04 DIAGNOSIS — F840 Autistic disorder: Secondary | ICD-10-CM | POA: Diagnosis not present

## 2024-06-04 DIAGNOSIS — F4312 Post-traumatic stress disorder, chronic: Secondary | ICD-10-CM | POA: Diagnosis not present

## 2024-06-05 DIAGNOSIS — M228X2 Other disorders of patella, left knee: Secondary | ICD-10-CM | POA: Diagnosis not present

## 2024-06-05 DIAGNOSIS — M25562 Pain in left knee: Secondary | ICD-10-CM | POA: Diagnosis not present

## 2024-06-05 DIAGNOSIS — M7652 Patellar tendinitis, left knee: Secondary | ICD-10-CM | POA: Diagnosis not present

## 2024-06-06 DIAGNOSIS — F840 Autistic disorder: Secondary | ICD-10-CM | POA: Diagnosis not present

## 2024-06-10 DIAGNOSIS — F840 Autistic disorder: Secondary | ICD-10-CM | POA: Diagnosis not present

## 2024-06-17 DIAGNOSIS — F840 Autistic disorder: Secondary | ICD-10-CM | POA: Diagnosis not present

## 2024-06-18 DIAGNOSIS — F319 Bipolar disorder, unspecified: Secondary | ICD-10-CM | POA: Diagnosis not present

## 2024-06-18 DIAGNOSIS — F909 Attention-deficit hyperactivity disorder, unspecified type: Secondary | ICD-10-CM | POA: Diagnosis not present

## 2024-06-18 DIAGNOSIS — F4312 Post-traumatic stress disorder, chronic: Secondary | ICD-10-CM | POA: Diagnosis not present

## 2024-06-18 DIAGNOSIS — R000 Tachycardia, unspecified: Secondary | ICD-10-CM | POA: Diagnosis not present

## 2024-06-18 DIAGNOSIS — F840 Autistic disorder: Secondary | ICD-10-CM | POA: Diagnosis not present

## 2024-06-19 DIAGNOSIS — R000 Tachycardia, unspecified: Secondary | ICD-10-CM

## 2024-06-20 DIAGNOSIS — F840 Autistic disorder: Secondary | ICD-10-CM | POA: Diagnosis not present
